# Patient Record
Sex: Female | Born: 1935 | Race: White | Hispanic: No | Marital: Married | Smoking: Former smoker
Health system: Southern US, Community
[De-identification: ages and names within clinical notes are randomized; demographics above are authoritative.]

## PROBLEM LIST (undated history)

## (undated) DIAGNOSIS — R251 Tremor, unspecified: Secondary | ICD-10-CM

## (undated) DIAGNOSIS — R0600 Dyspnea, unspecified: Secondary | ICD-10-CM

## (undated) DIAGNOSIS — R7303 Prediabetes: Secondary | ICD-10-CM

## (undated) DIAGNOSIS — R011 Cardiac murmur, unspecified: Secondary | ICD-10-CM

## (undated) DIAGNOSIS — R52 Pain, unspecified: Secondary | ICD-10-CM

## (undated) DIAGNOSIS — I10 Essential (primary) hypertension: Secondary | ICD-10-CM

## (undated) DIAGNOSIS — C50911 Malignant neoplasm of unspecified site of right female breast: Secondary | ICD-10-CM

## (undated) DIAGNOSIS — C50919 Malignant neoplasm of unspecified site of unspecified female breast: Secondary | ICD-10-CM

## (undated) DIAGNOSIS — D649 Anemia, unspecified: Secondary | ICD-10-CM

## (undated) HISTORY — DX: Malignant neoplasm of unspecified site of right female breast: C50.911

## (undated) HISTORY — PX: ABDOMINAL HYSTERECTOMY: SHX81

## (undated) HISTORY — PX: JOINT REPLACEMENT: SHX530

## (undated) HISTORY — PX: BREAST LUMPECTOMY: SHX2

## (undated) HISTORY — PX: COLON SURGERY: SHX602

---

## 1976-07-13 HISTORY — PX: PARTIAL HYSTERECTOMY: SHX80

## 2004-04-15 ENCOUNTER — Other Ambulatory Visit: Payer: Self-pay

## 2004-04-22 ENCOUNTER — Inpatient Hospital Stay: Payer: Self-pay | Admitting: Unknown Physician Specialty

## 2004-12-30 ENCOUNTER — Ambulatory Visit: Payer: Self-pay | Admitting: Internal Medicine

## 2006-01-21 ENCOUNTER — Ambulatory Visit: Payer: Self-pay | Admitting: Internal Medicine

## 2007-01-25 ENCOUNTER — Ambulatory Visit: Payer: Self-pay | Admitting: Internal Medicine

## 2008-01-27 ENCOUNTER — Ambulatory Visit: Payer: Self-pay | Admitting: Internal Medicine

## 2008-02-07 ENCOUNTER — Ambulatory Visit: Payer: Self-pay | Admitting: Gastroenterology

## 2009-01-28 ENCOUNTER — Ambulatory Visit: Payer: Self-pay | Admitting: Internal Medicine

## 2009-01-31 ENCOUNTER — Ambulatory Visit: Payer: Self-pay | Admitting: Internal Medicine

## 2009-06-17 ENCOUNTER — Ambulatory Visit: Payer: Self-pay | Admitting: Family Medicine

## 2009-06-24 ENCOUNTER — Ambulatory Visit: Payer: Self-pay | Admitting: Specialist

## 2009-09-24 ENCOUNTER — Ambulatory Visit: Payer: Self-pay | Admitting: Specialist

## 2010-02-19 ENCOUNTER — Ambulatory Visit: Payer: Self-pay | Admitting: Specialist

## 2010-07-13 DIAGNOSIS — C50919 Malignant neoplasm of unspecified site of unspecified female breast: Secondary | ICD-10-CM

## 2010-07-13 DIAGNOSIS — C50911 Malignant neoplasm of unspecified site of right female breast: Secondary | ICD-10-CM

## 2010-07-13 HISTORY — DX: Malignant neoplasm of unspecified site of right female breast: C50.911

## 2010-07-13 HISTORY — DX: Malignant neoplasm of unspecified site of unspecified female breast: C50.919

## 2010-07-13 HISTORY — PX: BREAST BIOPSY: SHX20

## 2010-08-19 ENCOUNTER — Ambulatory Visit: Payer: Self-pay | Admitting: Specialist

## 2010-08-26 ENCOUNTER — Ambulatory Visit: Payer: Self-pay | Admitting: Internal Medicine

## 2010-08-27 ENCOUNTER — Ambulatory Visit: Payer: Self-pay | Admitting: Internal Medicine

## 2011-02-19 ENCOUNTER — Ambulatory Visit: Payer: Self-pay | Admitting: Specialist

## 2011-02-26 ENCOUNTER — Ambulatory Visit: Payer: Self-pay | Admitting: Surgery

## 2011-03-30 ENCOUNTER — Ambulatory Visit: Payer: Self-pay | Admitting: Surgery

## 2011-05-01 ENCOUNTER — Ambulatory Visit: Payer: Self-pay | Admitting: Surgery

## 2011-05-07 ENCOUNTER — Ambulatory Visit: Payer: Self-pay | Admitting: Surgery

## 2011-05-14 ENCOUNTER — Ambulatory Visit: Payer: Self-pay | Admitting: Internal Medicine

## 2011-05-22 ENCOUNTER — Ambulatory Visit: Payer: Self-pay | Admitting: Internal Medicine

## 2011-06-13 ENCOUNTER — Ambulatory Visit: Payer: Self-pay | Admitting: Internal Medicine

## 2011-06-15 ENCOUNTER — Ambulatory Visit: Payer: Self-pay | Admitting: Surgery

## 2011-07-14 ENCOUNTER — Ambulatory Visit: Payer: Self-pay | Admitting: Internal Medicine

## 2011-07-17 LAB — CBC CANCER CENTER
Eosinophil #: 0.1 x10 3/mm (ref 0.0–0.7)
Lymphocyte #: 0.7 x10 3/mm — ABNORMAL LOW (ref 1.0–3.6)
Lymphocyte %: 12 %
MCV: 89 fL (ref 80–100)
Monocyte #: 0.7 x10 3/mm (ref 0.0–0.7)
Neutrophil %: 74.3 %
Platelet: 178 x10 3/mm (ref 150–440)
RBC: 4.16 10*6/uL (ref 3.80–5.20)
RDW: 14.2 % (ref 11.5–14.5)
WBC: 6.2 x10 3/mm (ref 3.6–11.0)

## 2011-07-17 LAB — BASIC METABOLIC PANEL
Anion Gap: 6 — ABNORMAL LOW (ref 7–16)
BUN: 17 mg/dL (ref 7–18)
Calcium, Total: 9.9 mg/dL (ref 8.5–10.1)
Chloride: 102 mmol/L (ref 98–107)
Co2: 31 mmol/L (ref 21–32)
Creatinine: 0.79 mg/dL (ref 0.60–1.30)
EGFR (African American): >60
EGFR (Non-African Amer.): >60
Osmolality: 280 (ref 275–301)

## 2011-07-17 LAB — HEPATIC FUNCTION PANEL A (ARMC)
Bilirubin,Total: 0.2 mg/dL (ref 0.2–1.0)
SGOT(AST): 13 U/L — ABNORMAL LOW (ref 15–37)
Total Protein: 7 g/dL (ref 6.4–8.2)

## 2011-08-14 ENCOUNTER — Ambulatory Visit: Payer: Self-pay | Admitting: Internal Medicine

## 2011-09-23 ENCOUNTER — Ambulatory Visit: Payer: Self-pay | Admitting: Internal Medicine

## 2011-10-12 ENCOUNTER — Ambulatory Visit: Payer: Self-pay | Admitting: Internal Medicine

## 2011-10-23 LAB — CBC CANCER CENTER
Basophil #: 0 x10 3/mm (ref 0.0–0.1)
Basophil %: 0.1 %
HCT: 34.5 % — ABNORMAL LOW (ref 35.0–47.0)
Lymphocyte #: 0.5 x10 3/mm — ABNORMAL LOW (ref 1.0–3.6)
Lymphocyte %: 12.2 %
MCHC: 33.4 g/dL (ref 32.0–36.0)
MCV: 91 fL (ref 80–100)
Monocyte #: 0.6 x10 3/mm (ref 0.2–0.9)
Neutrophil %: 73.2 %
RBC: 3.8 10*6/uL (ref 3.80–5.20)
RDW: 13.3 % (ref 11.5–14.5)
WBC: 4.4 x10 3/mm (ref 3.6–11.0)

## 2011-10-23 LAB — HEPATIC FUNCTION PANEL A (ARMC)
Albumin: 3.4 g/dL (ref 3.4–5.0)
Bilirubin,Total: 0.3 mg/dL (ref 0.2–1.0)
SGPT (ALT): 20 U/L

## 2011-10-23 LAB — CREATININE, SERUM: EGFR (Non-African Amer.): >60

## 2011-11-11 ENCOUNTER — Ambulatory Visit: Payer: Self-pay | Admitting: Internal Medicine

## 2012-02-01 ENCOUNTER — Ambulatory Visit: Payer: Self-pay | Admitting: Surgery

## 2012-03-30 ENCOUNTER — Ambulatory Visit: Payer: Self-pay | Admitting: Internal Medicine

## 2012-04-12 ENCOUNTER — Ambulatory Visit: Payer: Self-pay | Admitting: Internal Medicine

## 2012-04-20 ENCOUNTER — Ambulatory Visit: Payer: Self-pay | Admitting: Radiation Oncology

## 2012-04-22 LAB — CBC CANCER CENTER
Basophil #: 0 x10 3/mm (ref 0.0–0.1)
HGB: 11.5 g/dL — ABNORMAL LOW (ref 12.0–16.0)
Lymphocyte #: 0.8 x10 3/mm — ABNORMAL LOW (ref 1.0–3.6)
MCH: 30.8 pg (ref 26.0–34.0)
MCHC: 33.1 g/dL (ref 32.0–36.0)
Monocyte #: 0.7 x10 3/mm (ref 0.2–0.9)
Monocyte %: 13.2 %
Neutrophil %: 70.6 %
Platelet: 183 x10 3/mm (ref 150–440)
RBC: 3.74 10*6/uL — ABNORMAL LOW (ref 3.80–5.20)
RDW: 12.9 % (ref 11.5–14.5)
WBC: 5.5 x10 3/mm (ref 3.6–11.0)

## 2012-04-22 LAB — HEPATIC FUNCTION PANEL A (ARMC)
Albumin: 3.4 g/dL (ref 3.4–5.0)
Alkaline Phosphatase: 46 U/L — ABNORMAL LOW (ref 50–136)
Bilirubin,Total: 0.2 mg/dL (ref 0.2–1.0)
SGOT(AST): 15 U/L (ref 15–37)
SGPT (ALT): 20 U/L (ref 12–78)
Total Protein: 6.7 g/dL (ref 6.4–8.2)

## 2012-04-22 LAB — CREATININE, SERUM: EGFR (African American): >60

## 2012-05-13 ENCOUNTER — Ambulatory Visit: Payer: Self-pay | Admitting: Internal Medicine

## 2012-10-20 ENCOUNTER — Ambulatory Visit: Payer: Self-pay | Admitting: Surgery

## 2013-03-28 ENCOUNTER — Ambulatory Visit: Payer: Self-pay | Admitting: Oncology

## 2013-04-12 ENCOUNTER — Ambulatory Visit: Payer: Self-pay | Admitting: Oncology

## 2013-04-21 ENCOUNTER — Ambulatory Visit: Payer: Self-pay | Admitting: Surgery

## 2013-04-24 LAB — CBC CANCER CENTER
Basophil #: 0 x10 3/mm (ref 0.0–0.1)
Eosinophil #: 0.1 x10 3/mm (ref 0.0–0.7)
HCT: 33.2 % — ABNORMAL LOW (ref 35.0–47.0)
Lymphocyte #: 1.1 x10 3/mm (ref 1.0–3.6)
Lymphocyte %: 19.4 %
MCHC: 34.4 g/dL (ref 32.0–36.0)
Monocyte #: 0.6 x10 3/mm (ref 0.2–0.9)
Monocyte %: 10 %
Neutrophil %: 68.5 %
RDW: 12.9 % (ref 11.5–14.5)
WBC: 5.8 x10 3/mm (ref 3.6–11.0)

## 2013-04-24 LAB — HEPATIC FUNCTION PANEL A (ARMC)
Albumin: 3.3 g/dL — ABNORMAL LOW (ref 3.4–5.0)
Bilirubin, Direct: 0.1 mg/dL (ref 0.00–0.20)
Bilirubin,Total: 0.2 mg/dL (ref 0.2–1.0)
SGOT(AST): 14 U/L — ABNORMAL LOW (ref 15–37)
SGPT (ALT): 22 U/L (ref 12–78)
Total Protein: 6.3 g/dL — ABNORMAL LOW (ref 6.4–8.2)

## 2013-04-24 LAB — CREATININE, SERUM
Creatinine: 0.79 mg/dL (ref 0.60–1.30)
EGFR (African American): >60

## 2013-05-13 ENCOUNTER — Ambulatory Visit: Payer: Self-pay | Admitting: Oncology

## 2013-05-16 IMAGING — CT CT SIM MISC
1 of 2 series · 16 of 32 positions shown, 20 images · non-contrast
Comparison: none

[Series 2: — · axial · 1.07mm/px · z∈[-841,-607]mm · 16 of 86 slices shown, 20 images]
[im 4/86  mediastinal]
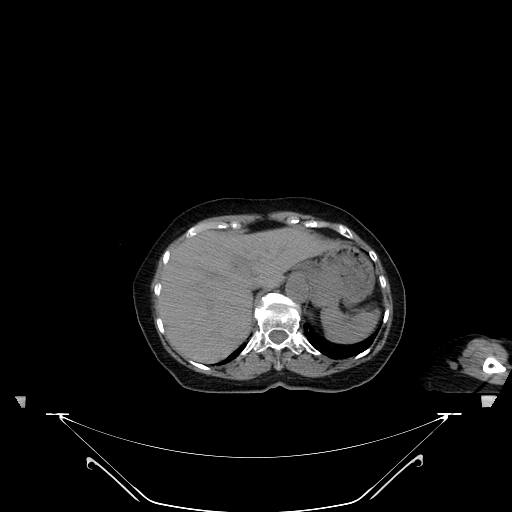
[im 4/86  lung]
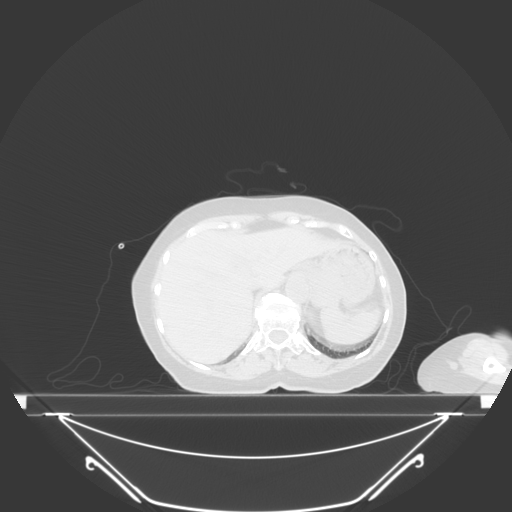
[im 10/86  lung]
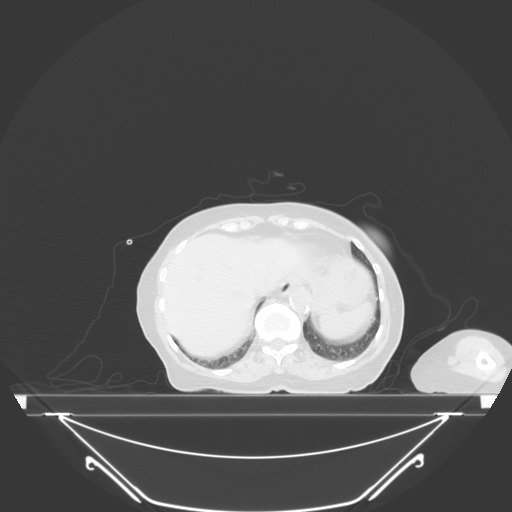
[im 16/86  lung]
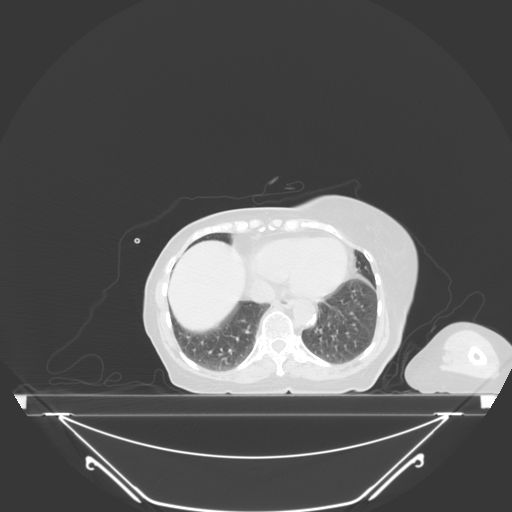
[im 19/86  lung]
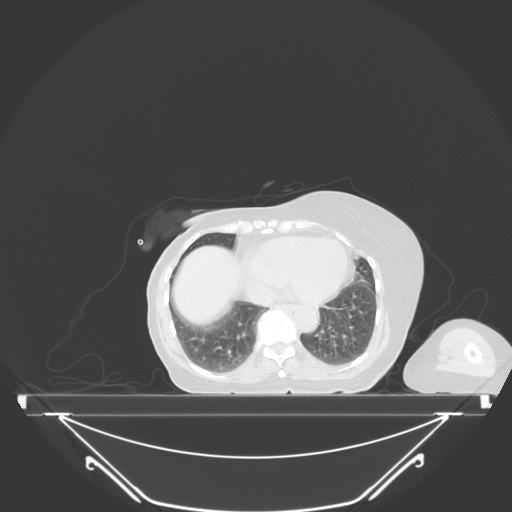
[im 26/86  mediastinal]
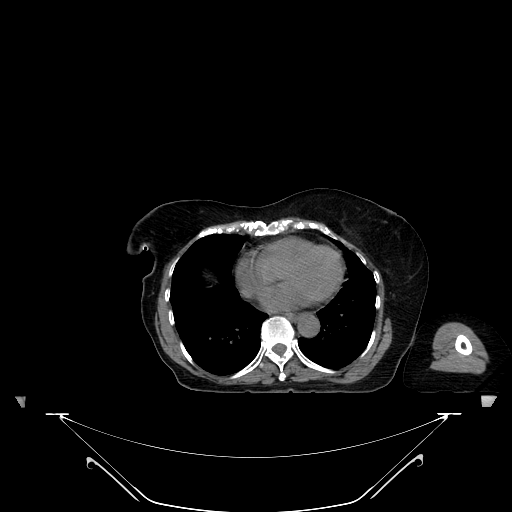
[im 26/86  lung]
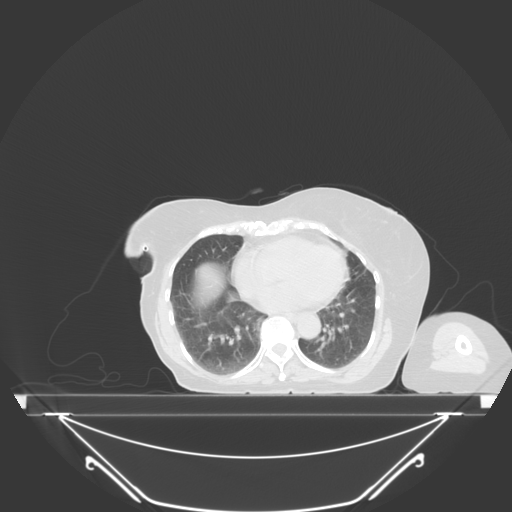
[im 32/86  lung]
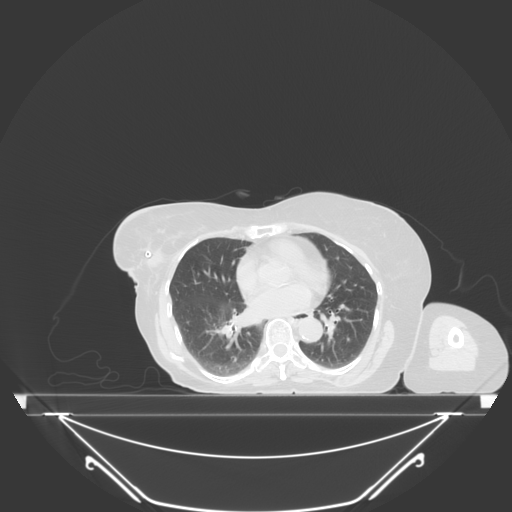
[im 35/86  lung]
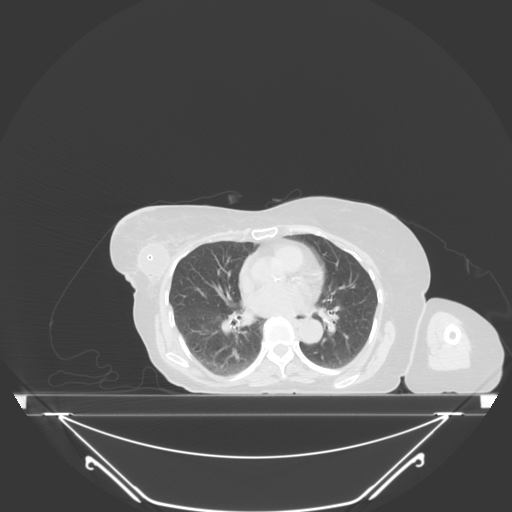
[im 41/86  lung]
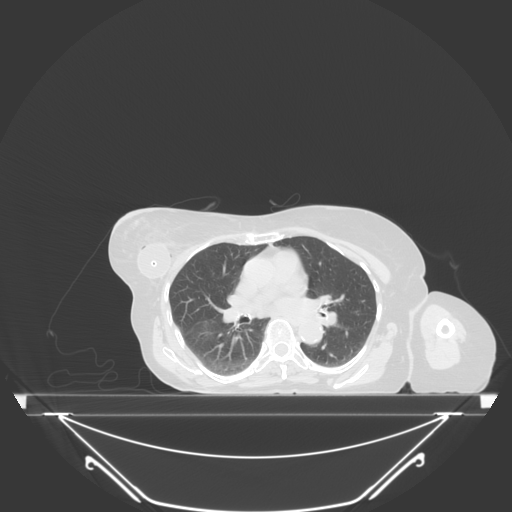
[im 45/86  mediastinal]
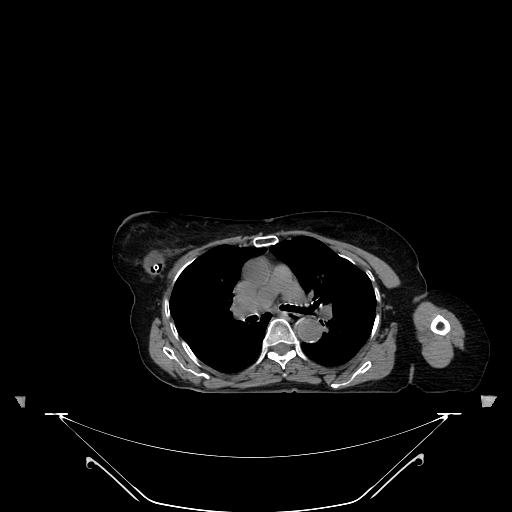
[im 45/86  lung]
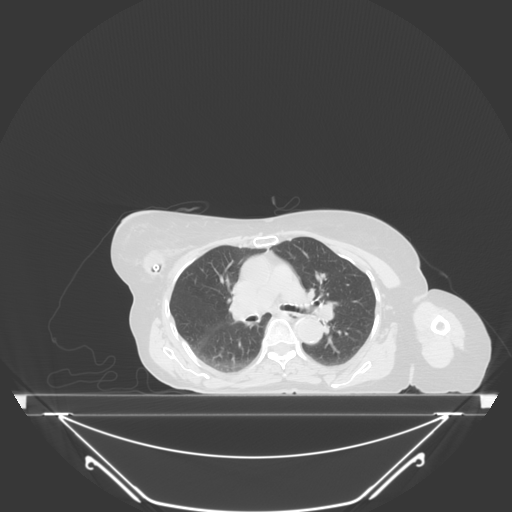
[im 51/86  lung]
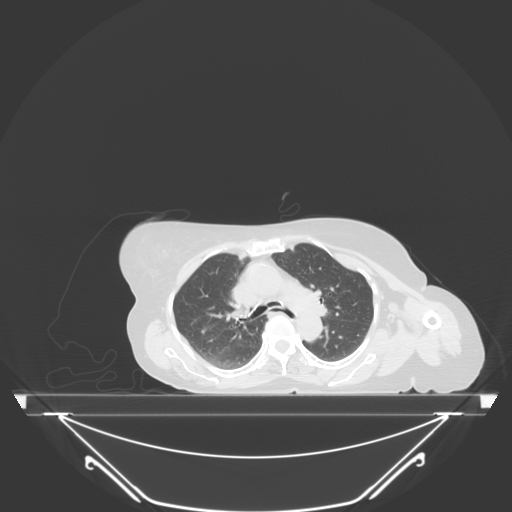
[im 54/86  lung]
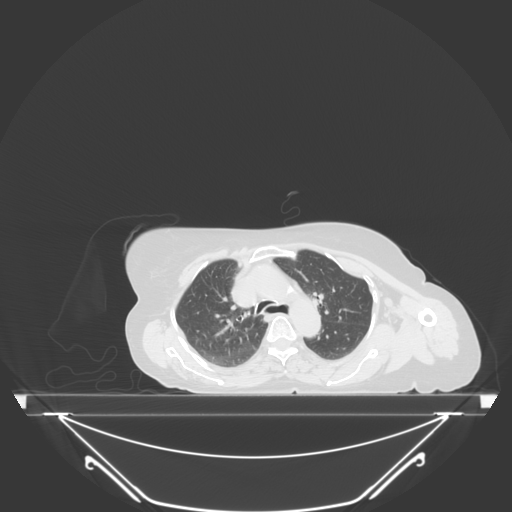
[im 60/86  lung]
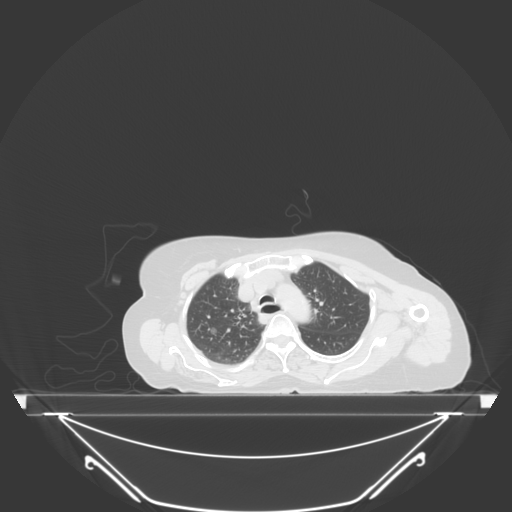
[im 67/86  mediastinal]
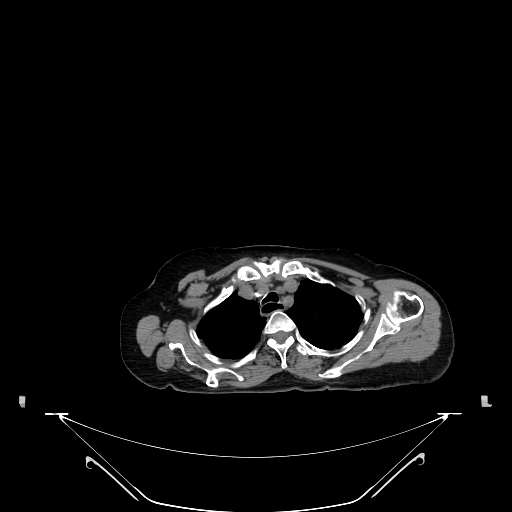
[im 67/86  lung]
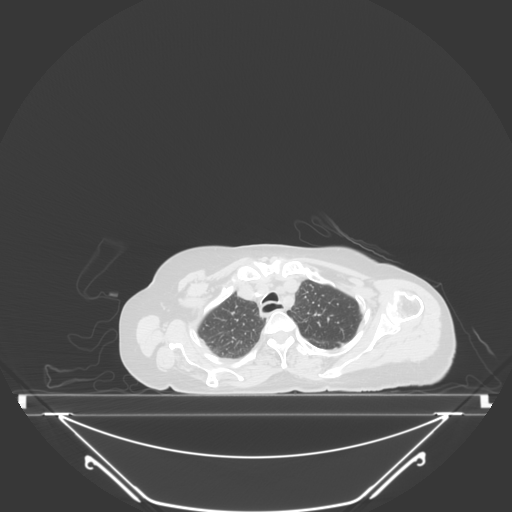
[im 70/86  lung]
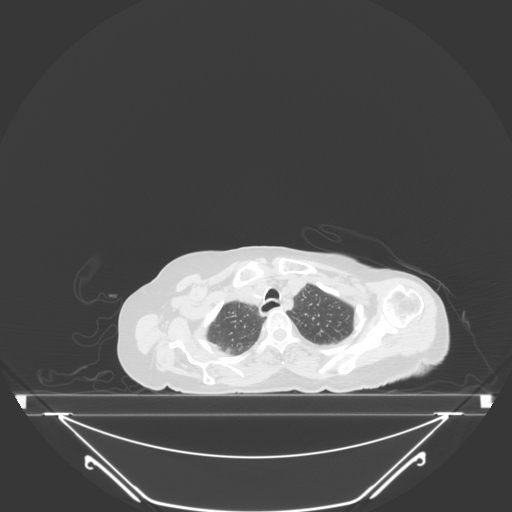
[im 76/86  lung]
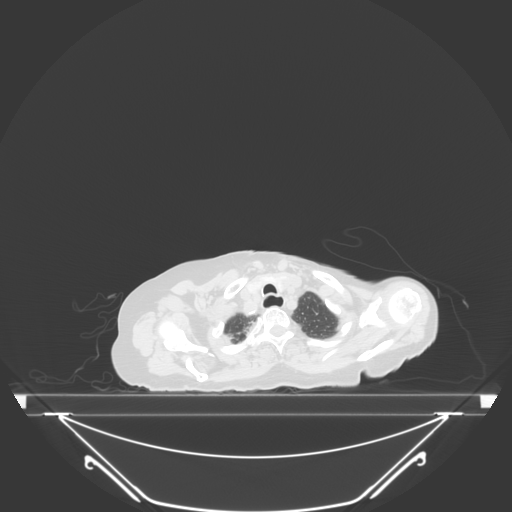
[im 82/86  lung]
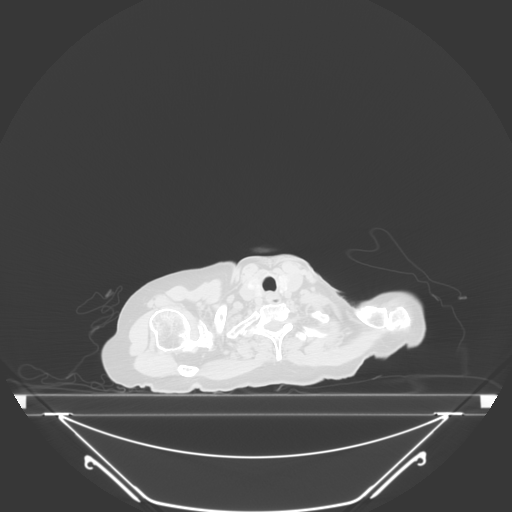

[16 of 32 positions shown; findings below may reference images not displayed]

IMAGES IMPORTED FROM THE [employer] WORKFLOW SYSTEM
NO DICTATION FOR STUDY

## 2014-03-20 ENCOUNTER — Ambulatory Visit: Payer: Self-pay | Admitting: Radiation Oncology

## 2014-04-12 ENCOUNTER — Ambulatory Visit: Payer: Self-pay | Admitting: Radiation Oncology

## 2014-05-07 ENCOUNTER — Ambulatory Visit: Payer: Self-pay | Admitting: Surgery

## 2014-10-24 ENCOUNTER — Ambulatory Visit
Admit: 2014-10-24 | Disposition: A | Discharge status: Home / Self Care | Payer: Self-pay | Attending: Internal Medicine | Admitting: Internal Medicine

## 2014-10-24 LAB — CBC CANCER CENTER
Basophil #: 0 x10 3/mm (ref 0.0–0.1)
Basophil %: 0.1 %
Eosinophil #: 0.1 x10 3/mm (ref 0.0–0.7)
Eosinophil %: 1.6 %
HCT: 34.4 % — ABNORMAL LOW (ref 35.0–47.0)
HGB: 11.5 g/dL — ABNORMAL LOW (ref 12.0–16.0)
LYMPHS PCT: 20.8 %
Lymphocyte #: 1.2 x10 3/mm (ref 1.0–3.6)
MCH: 31.2 pg (ref 26.0–34.0)
MCHC: 33.4 g/dL (ref 32.0–36.0)
MCV: 93 fL (ref 80–100)
MONO ABS: 0.7 x10 3/mm (ref 0.2–0.9)
Monocyte %: 11.7 %
NEUTROS PCT: 65.8 %
Neutrophil #: 3.9 x10 3/mm (ref 1.4–6.5)
PLATELETS: 180 x10 3/mm (ref 150–440)
RBC: 3.68 10*6/uL — ABNORMAL LOW (ref 3.80–5.20)
RDW: 12.9 % (ref 11.5–14.5)
WBC: 5.8 x10 3/mm (ref 3.6–11.0)

## 2014-10-24 LAB — HEPATIC FUNCTION PANEL A (ARMC)
ALK PHOS: 37 U/L — AB
ALT: 13 U/L — AB
Albumin: 3.6 g/dL
BILIRUBIN TOTAL: 0.5 mg/dL
Bilirubin, Direct: <0.1 mg/dL
SGOT(AST): 15 U/L
TOTAL PROTEIN: 6.4 g/dL — AB

## 2014-10-24 LAB — CREATININE, SERUM
CREATININE: 0.61 mg/dL
EGFR (African American): >60

## 2015-01-03 ENCOUNTER — Telehealth: Payer: Self-pay | Admitting: *Deleted

## 2015-01-03 MED ORDER — TAMOXIFEN CITRATE 20 MG PO TABS: 20.0000 mg | ORAL_TABLET | Freq: Every day | ORAL | Status: DC

## 2015-01-03 NOTE — Addendum Note (Signed)
Addended by: Betti Cruz on: 01/03/2015 04:16 PM   Modules accepted: Orders

## 2015-01-03 NOTE — Telephone Encounter (Signed)
escribed

## 2015-01-10 ENCOUNTER — Telehealth: Payer: Self-pay | Admitting: *Deleted

## 2015-01-10 MED ORDER — TAMOXIFEN CITRATE 20 MG PO TABS: 20.0000 mg | ORAL_TABLET | Freq: Every day | ORAL | Status: DC

## 2015-01-10 NOTE — Telephone Encounter (Signed)
Resubmitted tamoxifen to [facility]

## 2015-03-21 ENCOUNTER — Ambulatory Visit: Payer: Medicare Other | Attending: Radiation Oncology | Admitting: Radiation Oncology

## 2015-04-19 ENCOUNTER — Ambulatory Visit
Admission: RE | Admit: 2015-04-19 | Discharge: 2015-04-19 | Disposition: A | Discharge status: Home / Self Care | Payer: Medicare Other | Source: Ambulatory Visit | Attending: Radiation Oncology | Admitting: Radiation Oncology

## 2015-04-19 ENCOUNTER — Encounter: Payer: Self-pay | Admitting: Radiation Oncology

## 2015-04-19 ENCOUNTER — Other Ambulatory Visit: Payer: Self-pay | Admitting: *Deleted

## 2015-04-19 VITALS — BP 178/77 | HR 74 | Temp 98.0°F | Wt 132.7 lb

## 2015-04-19 DIAGNOSIS — Z853 Personal history of malignant neoplasm of breast: Secondary | ICD-10-CM

## 2015-04-19 DIAGNOSIS — C50511 Malignant neoplasm of lower-outer quadrant of right female breast: Secondary | ICD-10-CM

## 2015-04-19 HISTORY — DX: Malignant neoplasm of unspecified site of unspecified female breast: C50.919

## 2015-04-19 HISTORY — DX: Cardiac murmur, unspecified: R01.1

## 2015-04-19 HISTORY — DX: Essential (primary) hypertension: I10

## 2015-04-19 NOTE — Progress Notes (Signed)
Radiation Oncology Follow up Note  Name: Melinda Little   Date:   04/19/2015 MRN:  [id number] DOB: Sep 27, 1929    This 81 y.o. female presents to the clinic today for follow-up for breast cancer.  REFERRING PROVIDER: Dion Body, MD  HPI: Patient is an 81 year old female now out over 4 years having completed radiation therapy using high dose rate remote afterloading to her right breast for ductal carcinoma in situ ER/PR positive. She is seen today in routine follow-up and is doing well she is currently on tamoxifen without side effect.. Follow-up mammograms have been fine she has not had one since last March. And we are reordering diagnostic mammograms at this time. She specifically denies breast tenderness cough or bone pain.  COMPLICATIONS OF TREATMENT: none  FOLLOW UP COMPLIANCE: keeps appointments   PHYSICAL EXAM:  BP 178/77 mmHg  Pulse 74  Temp(Src) 98 F (36.7 C)  Wt 132 lb 11.5 oz (60.2 kg) Lungs are clear to A&P cardiac examination essentially unremarkable with regular rate and rhythm. No dominant mass or nodularity is noted in either breast in 2 positions examined. Incision is well-healed. No axillary or supraclavicular adenopathy is appreciated. Cosmetic result is excellent. She still has some telangiectatic changes of the skin in the area of high dose rate treatment. Cosmetic result is still good to excellent. Well-developed well-nourished patient in NAD. HEENT reveals PERLA, EOMI, discs not visualized.  Oral cavity is clear. No oral mucosal lesions are identified. Neck is clear without evidence of cervical or supraclavicular adenopathy. Lungs are clear to A&P. Cardiac examination is essentially unremarkable with regular rate and rhythm without murmur rub or thrill. Abdomen is benign with no organomegaly or masses noted. Motor sensory and DTR levels are equal and symmetric in the upper and lower extremities. Cranial nerves II through XII are grossly intact. Proprioception is  intact. No peripheral adenopathy or edema is identified. No motor or sensory levels are noted. Crude visual fields are within normal range.  RADIOLOGY RESULTS: Previous mammograms have been requested for my review  PLAN: Present time she is now over 4 years out with no evidence of disease. I've ordered diagnostic mammograms and will see her back in 1 year for follow-up. I will continue to follow patient has some are only breast specialist that is following her at this time. Patient knows to call sooner with any concerns.  I would like to take this opportunity for allowing me to participate in the care of your patient.Armstead Peaks., MD

## 2015-05-20 ENCOUNTER — Other Ambulatory Visit: Payer: Self-pay | Admitting: Radiation Oncology

## 2015-05-20 ENCOUNTER — Ambulatory Visit
Admission: RE | Admit: 2015-05-20 | Discharge: 2015-05-20 | Disposition: A | Discharge status: Home / Self Care | Payer: Medicare Other | Source: Ambulatory Visit | Attending: Radiation Oncology | Admitting: Radiation Oncology

## 2015-05-20 DIAGNOSIS — Z853 Personal history of malignant neoplasm of breast: Secondary | ICD-10-CM

## 2015-08-25 ENCOUNTER — Emergency Department: Payer: Medicare Other

## 2015-08-25 ENCOUNTER — Emergency Department
Admission: EM | Admit: 2015-08-25 | Discharge: 2015-08-25 | Disposition: A | Discharge status: Home / Self Care | Payer: Medicare Other | Attending: Emergency Medicine | Admitting: Emergency Medicine

## 2015-08-25 DIAGNOSIS — S32591A Other specified fracture of right pubis, initial encounter for closed fracture: Secondary | ICD-10-CM

## 2015-08-25 DIAGNOSIS — Y998 Other external cause status: Secondary | ICD-10-CM | POA: Insufficient documentation

## 2015-08-25 DIAGNOSIS — Z791 Long term (current) use of non-steroidal anti-inflammatories (NSAID): Secondary | ICD-10-CM | POA: Diagnosis not present

## 2015-08-25 DIAGNOSIS — Y9301 Activity, walking, marching and hiking: Secondary | ICD-10-CM | POA: Diagnosis not present

## 2015-08-25 DIAGNOSIS — W010XXA Fall on same level from slipping, tripping and stumbling without subsequent striking against object, initial encounter: Secondary | ICD-10-CM | POA: Diagnosis not present

## 2015-08-25 DIAGNOSIS — S3289XA Fracture of other parts of pelvis, initial encounter for closed fracture: Secondary | ICD-10-CM | POA: Diagnosis not present

## 2015-08-25 DIAGNOSIS — M25559 Pain in unspecified hip: Secondary | ICD-10-CM

## 2015-08-25 DIAGNOSIS — S329XXA Fracture of unspecified parts of lumbosacral spine and pelvis, initial encounter for closed fracture: Secondary | ICD-10-CM

## 2015-08-25 DIAGNOSIS — Y92480 Sidewalk as the place of occurrence of the external cause: Secondary | ICD-10-CM | POA: Diagnosis not present

## 2015-08-25 DIAGNOSIS — I10 Essential (primary) hypertension: Secondary | ICD-10-CM | POA: Diagnosis not present

## 2015-08-25 DIAGNOSIS — S3993XA Unspecified injury of pelvis, initial encounter: Secondary | ICD-10-CM | POA: Diagnosis present

## 2015-08-25 DIAGNOSIS — Z79899 Other long term (current) drug therapy: Secondary | ICD-10-CM | POA: Diagnosis not present

## 2015-08-25 DIAGNOSIS — Z7982 Long term (current) use of aspirin: Secondary | ICD-10-CM | POA: Insufficient documentation

## 2015-08-25 NOTE — ED Notes (Signed)
Patient states that she was walking out side yesterday and lost her balance and fell. Patient states that she is not sure if she hit her head or not. Patient is c/o right thigh pain, Patient is having trouble bearing weight on the right leg. Per daughter it took her 15 minutes to walk to the bathroom this morning and that usually only takes her about 3 minutes.

## 2015-08-25 NOTE — ED Notes (Signed)
Pt reports tripping and falling yesterday. Continued pain to right leg and left shoulder after fall.

## 2015-08-25 NOTE — ED Provider Notes (Signed)
[facility] Emergency Department Provider Note ____________________________________________  Time seen: 1354  I have reviewed the triage vital signs and the nursing notes.  HISTORY  Chief Complaint  Fall  HPI Melinda Little is a 82 y.o. female presents to the ED accompanied by her husband in an adult daughter for evaluation of pain and disability following a fall yesterday. The patient describes she was walking up the sidewalk at her daughter's home just behind her husband, when she accidentally tripped, falling off balance to the right side. She describes falling off of the sidewalk onto the grass. There was no reported head injury or loss of consciousness. The patient has atendency to drag her right foot as she walks due to chronic DJD of the right knee. She reports that this morning she had difficulty ambulating with the use of her walker due to increased pain to the right thigh and hip. She is here for evaluation of her injuries, reporting pain at 4/10 in triage. She doses tramadol intermittently for pain relief. She took one this morning at breakfast.  Past Medical History  Diagnosis Date  . Hypertension   . Heart murmur   . Breast cancer ([place]) 2012    right breast with lumpectomy and mammosite rad tx   There are no active problems to display for this patient.   Past Surgical History  Procedure Laterality Date  . Breast biopsy Right 2012    positve  . Breast biopsy Right 2012    negative    Current Outpatient Rx  Name  Route  Sig  Dispense  Refill  . aspirin EC 81 MG tablet   Oral   Take by mouth.         . Cholecalciferol (VITAMIN D-1000 MAX ST) 1000 UNITS tablet   Oral   Take by mouth.         . ferrous sulfate 325 (65 FE) MG tablet   Oral   Take by mouth.         Marland Kitchen ibuprofen (ADVIL,MOTRIN) 200 MG tablet   Oral   Take by mouth.         Marland Kitchen lisinopril (PRINIVIL,ZESTRIL) 10 MG tablet      TAKE 1 TABLET BY MOUTH EVERY DAY          . tamoxifen (NOLVADEX) 20 MG tablet   Oral   Take 1 tablet (20 mg total) by mouth daily.   90 tablet   3   . tamoxifen (NOLVADEX) 20 MG tablet   Oral   Take by mouth.          Allergies Ciprofloxacin  No family history on file.  Social History Social History  Substance Use Topics  . Smoking status: Not on file  . Smokeless tobacco: Not on file  . Alcohol Use: Not on file   Review of Systems  Constitutional: Negative for fever. Cardiovascular: Negative for chest pain. Respiratory: Negative for shortness of breath. Gastrointestinal: Negative for abdominal pain, vomiting and diarrhea. Musculoskeletal: Negative for back pain. Right hip and thigh pain as above. Skin: Negative for rash. Neurological: Negative for headaches, focal weakness or numbness. ____________________________________________  PHYSICAL EXAM:  VITAL SIGNS: ED Triage Vitals  Enc Vitals Group     BP 08/25/15 1141 158/50 mmHg     Pulse Rate 08/25/15 1141 90     Resp 08/25/15 1141 18     Temp 08/25/15 1141 98.5 F (36.9 C)     Temp Source 08/25/15 1141 Oral  SpO2 08/25/15 1141 99 %     Weight 08/25/15 1141 130 lb (58.968 kg)     Height 08/25/15 1141 5\' 4"  (1.626 m)     Head Cir --      Peak Flow --      Pain Score 08/25/15 1143 4     Pain Loc --      Pain Edu? --      Excl. in [place]? --    Constitutional: Alert and oriented. Well appearing and in no distress. Head: Normocephalic and atraumatic.      Eyes: Conjunctivae are normal. PERRL. Normal extraocular movements      Ears: Canals clear. TMs intact bilaterally.   Nose: No congestion/rhinorrhea.   Mouth/Throat: Mucous membranes are moist.   Neck: Supple. No thyromegaly. Hematological/Lymphatic/Immunological: No cervical lymphadenopathy. Cardiovascular: Normal rate, regular rhythm.  Respiratory: Normal respiratory effort. No wheezes/rales/rhonchi. Gastrointestinal: Soft and nontender. No distention. Musculoskeletal: RLE  without obvious rotation, shortening or deformity. Right knee with decreased extension ROM due to chronic, severe, DJD. Patient localizes tenderness to the posterior thigh and buttocks. Normal seated SLR bilaterally. Nontender with normal range of motion in all extremities.  Neurologic:  Normal gait without ataxia. Normal speech and language. No gross focal neurologic deficits are appreciated. Skin:  Skin is warm, dry and intact. No rash noted. Psychiatric: Mood and affect are normal. Patient exhibits appropriate insight and judgment. ____________________________________________   RADIOLOGY  Right Hip IMPRESSION: Advanced osteoarthritis in the knee joint region with small joint effusion. Mild narrowing right hip joint. No acute fracture or Dislocation.  Pelvis IMPRESSION: Displaced fractures of the right superior pubic ramus and ischium. No other fractures. No dislocation. Mild symmetric narrowing both hip joints. Bones osteoporotic  I, Alyla Pietila, Dannielle Karvonen, personally viewed and evaluated these images (plain radiographs) as part of my medical decision making, as well as reviewing the written report by the radiologist. ____________________________________________  [facility] provider ____________________________________________  INITIAL IMPRESSION / ASSESSMENT AND PLAN / ED COURSE  ----------------------------------------- 3:45 PM on 08/25/2015 ----------------------------------------- Spoke with Dr. Roland Rack, he will consult the patient here in the ED. Following review of films, Dr. Roland Rack advises that the fractures are stable. The patient may opt for inpatient management of pain if needed. Otherwise, if stable, patient may discharge home and follow-up with him in 3 weeks. Patient should be toe-touch to partial weight bearing with the walker.   Patient decided she did not want to be admitted to the hospital at this time. She opted to be discharged home to the care of her  husband. She was able to demonstrate ability to ambulate with the use of the walker without difficulty. Patient was advised to dose her previous he prescribed tramadol up to twice daily for pain relief. She is also encouraged and reminded to limit walking and standing due to this injury. She will follow Dr. Roland Rack as directed or return to the ED for acutely worsening symptoms including pain, disability, or SOB. ____________________________________________  FINAL CLINICAL IMPRESSION(S) / ED DIAGNOSES  Final diagnoses:  Pelvic fracture, closed, initial encounter  Pubic ramus fracture, right, closed, initial encounter [facility])      Melvenia Needles, PA-C 08/27/15 0034  Carrie Mew, MD 08/27/15 2316

## 2015-08-25 NOTE — Discharge Instructions (Signed)
Simple Pelvic Fracture, Adult °A pelvic fracture is a break in one of the pelvic bones. The pelvic bones include the bones that you sit on and the bones that make up the lower part of your spine. A pelvic fracture is called simple if the broken bones are stable and are not moving out of place. °CAUSES  °Common causes of this type of fracture include: °· A fall. °· A car accident. °· Force or pressure applied to the pelvis. °RISK FACTORS °You may be at higher risk for this type of fracture if: °· You play high-impact sports. °· You are an older person with a condition that causes weak bones (osteoporosis). °· You have a bone-weakening disease. °SIGNS AND SYMPTOMS °Signs and symptoms may include: °· Tenderness, swelling, or bruising in the affected area. °· Pain when moving the hip. °· Pain when walking or standing. °DIAGNOSIS °A diagnosis is made with a physical exam and X-rays. Sometimes, a CT scan is also done. °TREATMENT °The goal of treatment is to get the bones to heal in a good position. Treatment of a simple pelvic fracture usually involves staying in bed (bed rest) and using crutches or a walker until the bones heal. Medicines may be prescribed for pain. Medicines may also be prescribed that help to prevent blood clots from forming in the legs. °HOME CARE INSTRUCTIONS °Managing Pain, Stiffness, and Swelling °· If directed, apply ice to the injured area: °¨ Put ice in a plastic bag. °¨ Place a towel between your skin and the bag. °¨ Leave the ice on for 20 minutes, 2-3 times a day. °· Raise the injured area above the level of your heart while you are sitting or lying down. °Driving °· Do not  drive or operate heavy machinery until your health care provider tells you it is safe to do. °Activity °· Stay on bed rest for as long as directed by your health care provider. °· While on bed rest: °¨ Change the position of your legs every 1-2 hours. This keeps blood moving well through both of your legs. °¨ You may sit  for as long as you feel comfortable. °· After bed rest: °¨ Avoid strenuous activities for as long as directed by your health care provider. °¨ Return to your normal activities as directed by your health care provider. Ask your health care provider what activities are safe for you. °Safety °· Do not use the injured limb to support your body weight until your health care provider says that you can. Use crutches or a walker as directed by your health care provider. °General Instructions °· Do not use any tobacco products, including cigarettes, chewing tobacco, or electronic cigarettes. Tobacco can delay bone healing. If you need help quitting, ask your health care provider. °· Take medicines only as directed by your health care provider. °· Keep all follow-up visits as directed by your health care provider. This is important. °SEEK MEDICAL CARE IF: °· Your pain gets worse. °· Your pain is not relieved with medicines. °SEEK IMMEDIATE MEDICAL CARE IF: °· You feel light-headed or faint. °· You develop chest pain. °· You develop shortness of breath. °· You have a fever. °· You have blood in your urine or your stools. °· You have vaginal bleeding. °· You have difficulty or pain with urination or with a bowel movement. °· You have difficulty or increased pain with walking. °· You have new or increased swelling in one of your legs. °· You have numbness in your   legs or groin area.   This information is not intended to replace advice given to you by your health care provider. Make sure you discuss any questions you have with your health care provider.   Document Released: 09/07/2001 Document Revised: 07/20/2014 Document Reviewed: 02/20/2014 Elsevier Interactive Patient Education 2016 [place] should dose your home Tramadol as needed for pain. Rest with the legs elevated when seated.  Use the walker to stand and walk. You should not bear weight on the right side. Use only the toes down to stabilize yourself.  Follow-up with Dr. Roland Rack as directed. Return to the ED as needed for any worsening of pain or mobility.

## 2015-10-25 ENCOUNTER — Inpatient Hospital Stay (HOSPITAL_BASED_OUTPATIENT_CLINIC_OR_DEPARTMENT_OTHER): Payer: Medicare Other

## 2015-10-25 ENCOUNTER — Other Ambulatory Visit: Payer: Self-pay | Admitting: *Deleted

## 2015-10-25 ENCOUNTER — Inpatient Hospital Stay: Payer: Medicare Other | Attending: Internal Medicine | Admitting: Internal Medicine

## 2015-10-25 ENCOUNTER — Encounter: Payer: Self-pay | Admitting: Internal Medicine

## 2015-10-25 ENCOUNTER — Encounter: Payer: Self-pay | Admitting: *Deleted

## 2015-10-25 VITALS — BP 155/66 | HR 77 | Temp 97.0°F | Resp 18 | Wt 127.9 lb

## 2015-10-25 DIAGNOSIS — Z170 Estrogen receptor positive status [ER+]: Secondary | ICD-10-CM | POA: Diagnosis not present

## 2015-10-25 DIAGNOSIS — I10 Essential (primary) hypertension: Secondary | ICD-10-CM | POA: Insufficient documentation

## 2015-10-25 DIAGNOSIS — Z79899 Other long term (current) drug therapy: Secondary | ICD-10-CM | POA: Insufficient documentation

## 2015-10-25 DIAGNOSIS — D649 Anemia, unspecified: Secondary | ICD-10-CM | POA: Insufficient documentation

## 2015-10-25 DIAGNOSIS — E871 Hypo-osmolality and hyponatremia: Secondary | ICD-10-CM | POA: Insufficient documentation

## 2015-10-25 DIAGNOSIS — Z853 Personal history of malignant neoplasm of breast: Secondary | ICD-10-CM | POA: Insufficient documentation

## 2015-10-25 DIAGNOSIS — Z923 Personal history of irradiation: Secondary | ICD-10-CM | POA: Diagnosis not present

## 2015-10-25 DIAGNOSIS — C50911 Malignant neoplasm of unspecified site of right female breast: Secondary | ICD-10-CM

## 2015-10-25 DIAGNOSIS — Z7982 Long term (current) use of aspirin: Secondary | ICD-10-CM | POA: Diagnosis not present

## 2015-10-25 LAB — COMPREHENSIVE METABOLIC PANEL
ALK PHOS: 134 U/L — AB (ref 38–126)
ALT: 16 U/L (ref 14–54)
AST: 19 U/L (ref 15–41)
Albumin: 4.1 g/dL (ref 3.5–5.0)
Anion gap: 3 — ABNORMAL LOW (ref 5–15)
BILIRUBIN TOTAL: 0.4 mg/dL (ref 0.3–1.2)
BUN: 15 mg/dL (ref 6–20)
CHLORIDE: 102 mmol/L (ref 101–111)
CO2: 23 mmol/L (ref 22–32)
Calcium: 8.5 mg/dL — ABNORMAL LOW (ref 8.9–10.3)
Creatinine, Ser: 0.62 mg/dL (ref 0.44–1.00)
GFR calc Af Amer: >60 mL/min (ref >60)
Glucose, Bld: 113 mg/dL — ABNORMAL HIGH (ref 65–99)
POTASSIUM: 4.2 mmol/L (ref 3.5–5.1)
Sodium: 128 mmol/L — ABNORMAL LOW (ref 135–145)
TOTAL PROTEIN: 6.8 g/dL (ref 6.5–8.1)

## 2015-10-25 LAB — CBC WITH DIFFERENTIAL/PLATELET
BASOS ABS: 0 10*3/uL (ref 0–0.1)
Basophils Relative: 0 %
Eosinophils Absolute: 0.1 10*3/uL (ref 0–0.7)
Eosinophils Relative: 1 %
HEMATOCRIT: 31.5 % — AB (ref 35.0–47.0)
HEMOGLOBIN: 10.8 g/dL — AB (ref 12.0–16.0)
LYMPHS PCT: 15 %
Lymphs Abs: 0.9 10*3/uL — ABNORMAL LOW (ref 1.0–3.6)
MCH: 30.8 pg (ref 26.0–34.0)
MCHC: 34.3 g/dL (ref 32.0–36.0)
MCV: 89.9 fL (ref 80.0–100.0)
MONO ABS: 0.6 10*3/uL (ref 0.2–0.9)
Monocytes Relative: 11 %
NEUTROS ABS: 4.4 10*3/uL (ref 1.4–6.5)
NEUTROS PCT: 73 %
Platelets: 234 10*3/uL (ref 150–440)
RBC: 3.5 MIL/uL — ABNORMAL LOW (ref 3.80–5.20)
RDW: 13.5 % (ref 11.5–14.5)
WBC: 6 10*3/uL (ref 3.6–11.0)

## 2015-10-25 LAB — IRON AND TIBC
IRON: 39 ug/dL (ref 28–170)
SATURATION RATIOS: 12 % (ref 10.4–31.8)
TIBC: 322 ug/dL (ref 250–450)
UIBC: 283 ug/dL

## 2015-10-25 LAB — FERRITIN: FERRITIN: 376 ng/mL — AB (ref 11–307)

## 2015-10-25 NOTE — Progress Notes (Signed)
[facility] OFFICE PROGRESS NOTE  Patient Care Team: Dion Body, MD as PCP - General (Family Medicine)   SUMMARY OF ONCOLOGIC HISTORY:  # 2012- DCIS of RIGHT BREAST s/p Lumpec  [Dr.Crystal]; Jan 2013- START Tam; April 2017-STOP TAMOXIFEN  # July 2015  Anemia mild [~11]  INTERVAL HISTORY:  This is my first interaction with the patient since I joined the practice September 2016. I reviewed the patient's prior charts/pertinent labs/imaging in detail; findings are summarized above.   82 year old very pleasant Caucasian female patient with above history of DCIS is here for follow-up.  Patient's appetite is good. Denies any weight loss. Denies any lumps or bumps. No nausea no vomiting. No blood in stools black stools. She had colonoscopy many years ago.  Patient does have hot flashes. She history of fall for which she has arthritis this is not new. She walks with a cane. She is concerned about the blood clots and strokes from tamoxifen.   REVIEW OF SYSTEMS:  A complete 10 point review of system is done which is negative except mentioned above/history of present illness.   PAST MEDICAL HISTORY :  Past Medical History  Diagnosis Date  . Hypertension   . Heart murmur   . Breast cancer ([place]) 2012    right breast with lumpectomy and mammosite rad tx    PAST SURGICAL HISTORY :   Past Surgical History  Procedure Laterality Date  . Breast biopsy Right 2012    positve  . Breast biopsy Right 2012    negative  . Breast lumpectomy    . Partial hysterectomy  1978    FAMILY HISTORY :  No family history on file.  SOCIAL HISTORY:   Social History  Substance Use Topics  . Smoking status: Not on file  . Smokeless tobacco: Not on file  . Alcohol Use: Not on file    ALLERGIES:  is allergic to ciprofloxacin.  MEDICATIONS:  Current Outpatient Prescriptions  Medication Sig Dispense Refill  . aspirin EC 81 MG tablet Take by mouth.    . Cholecalciferol (VITAMIN  D-1000 MAX ST) 1000 UNITS tablet Take by mouth.    . ferrous sulfate 325 (65 FE) MG tablet Take by mouth.    Marland Kitchen ibuprofen (ADVIL,MOTRIN) 200 MG tablet Take by mouth.    Marland Kitchen lisinopril (PRINIVIL,ZESTRIL) 10 MG tablet TAKE 1 TABLET BY MOUTH EVERY DAY    . tamoxifen (NOLVADEX) 20 MG tablet Take 1 tablet (20 mg total) by mouth daily. 90 tablet 3  . tamoxifen (NOLVADEX) 20 MG tablet Take by mouth.    . traMADol (ULTRAM) 50 MG tablet Take 50 mg by mouth every 6 (six) hours as needed.     No current facility-administered medications for this visit.    PHYSICAL EXAMINATION:   BP 155/66 mmHg  Pulse 77  Temp(Src) 97 F (36.1 C) (Tympanic)  Wt 127 lb 13.9 oz (58 kg)  Filed Weights   10/25/15 1101  Weight: 127 lb 13.9 oz (58 kg)    GENERAL: Well-nourished well-developed; Alert, no distress and comfortable.  Alone. She walks with a cane. EYES: no pallor or icterus OROPHARYNX: no thrush or ulceration; good dentition  NECK: supple, no masses felt LYMPH:  no palpable lymphadenopathy in the cervical, axillary or inguinal regions LUNGS: clear to auscultation and  No wheeze or crackles HEART/CVS: regular rate & rhythm and no murmurs; No lower extremity edema ABDOMEN:abdomen soft, non-tender and normal bowel sounds Musculoskeletal:no cyanosis of digits and no clubbing  PSYCH:  alert & oriented x 3 with fluent speech NEURO: no focal motor/sensory deficits SKIN:  no rashes or significant lesions Right and left BREAST exam [in the presence of nurse]- no unusual skin changes or dominant masses felt. Telangiectasia from radiation noted. Surgical scars noted.    LABORATORY DATA:  I have reviewed the data as listed    Component Value Date/Time   NA 128* 10/25/2015 1019   NA 139 07/17/2011 1003   K 4.2 10/25/2015 1019   K 4.0 07/17/2011 1003   CL 102 10/25/2015 1019   CL 102 07/17/2011 1003   CO2 23 10/25/2015 1019   CO2 31 07/17/2011 1003   GLUCOSE 113* 10/25/2015 1019   GLUCOSE 117* 07/17/2011  1003   BUN 15 10/25/2015 1019   BUN 17 07/17/2011 1003   CREATININE 0.62 10/25/2015 1019   CREATININE 0.61 10/24/2014 1039   CALCIUM 8.5* 10/25/2015 1019   CALCIUM 9.9 07/17/2011 1003   PROT 6.8 10/25/2015 1019   PROT 6.4* 10/24/2014 1039   ALBUMIN 4.1 10/25/2015 1019   ALBUMIN 3.6 10/24/2014 1039   AST 19 10/25/2015 1019   AST 15 10/24/2014 1039   ALT 16 10/25/2015 1019   ALT 13* 10/24/2014 1039   ALKPHOS 134* 10/25/2015 1019   ALKPHOS 37* 10/24/2014 1039   BILITOT 0.4 10/25/2015 1019   BILITOT 0.5 10/24/2014 1039   GFRNONAA >60 10/25/2015 1019   GFRNONAA >60 10/24/2014 1039   GFRNONAA >60 07/17/2011 1003   GFRAA >60 10/25/2015 1019   GFRAA >60 10/24/2014 1039   GFRAA >60 07/17/2011 1003    No results found for: SPEP, UPEP  Lab Results  Component Value Date   WBC 6.0 10/25/2015   NEUTROABS 4.4 10/25/2015   HGB 10.8* 10/25/2015   HCT 31.5* 10/25/2015   MCV 89.9 10/25/2015   PLT 234 10/25/2015      Chemistry      Component Value Date/Time   NA 128* 10/25/2015 1019   NA 139 07/17/2011 1003   K 4.2 10/25/2015 1019   K 4.0 07/17/2011 1003   CL 102 10/25/2015 1019   CL 102 07/17/2011 1003   CO2 23 10/25/2015 1019   CO2 31 07/17/2011 1003   BUN 15 10/25/2015 1019   BUN 17 07/17/2011 1003   CREATININE 0.62 10/25/2015 1019   CREATININE 0.61 10/24/2014 1039      Component Value Date/Time   CALCIUM 8.5* 10/25/2015 1019   CALCIUM 9.9 07/17/2011 1003   ALKPHOS 134* 10/25/2015 1019   ALKPHOS 37* 10/24/2014 1039   AST 19 10/25/2015 1019   AST 15 10/24/2014 1039   ALT 16 10/25/2015 1019   ALT 13* 10/24/2014 1039   BILITOT 0.4 10/25/2015 1019   BILITOT 0.5 10/24/2014 1039        ASSESSMENT & PLAN:   # DCIS of RIGHT BREAST s/p Lumpec  [Dr.Crystal]- Recent mammogram November 2016 with normal limits. Clinically no evidence of recurrence. Discussed regarding tamoxifen patient concerned about the potential side effects of strokes; I think it's reasonable to stop  tamoxifen at this time.  # Anemia- hemoglobin-10.8;Reviewed hemoglobin in care everywhere- anemia chronic dating back to July 2015 when the hemoglobin was 11.9. no clear etiology recommend iron studies stool occult card. If iron deficiency recommend colonoscopy/we will check with PCP. Otherwise continue monitoring for now.  # Hyponatremia sodium 128; otherwise rest of the kidney numbers are within normal limits. I reviewed the labs from [facility] from January 2017. Sodium was 136. This likely needs to  be repeated/ will defer to PCP.   # Otherwise patient will follow-up with [place] in one year with CBC CMP.    Cammie Sickle, MD 10/25/2015 11:26 AM

## 2015-10-25 NOTE — Progress Notes (Signed)
Patient ambulates with a cane, brought to exam room 4.  Vitals documented, medication record updated, information provided by patient.

## 2015-10-27 DIAGNOSIS — Z853 Personal history of malignant neoplasm of breast: Secondary | ICD-10-CM | POA: Diagnosis not present

## 2015-10-28 ENCOUNTER — Other Ambulatory Visit: Payer: Self-pay | Admitting: *Deleted

## 2015-10-28 DIAGNOSIS — Z853 Personal history of malignant neoplasm of breast: Secondary | ICD-10-CM | POA: Diagnosis not present

## 2015-10-28 DIAGNOSIS — D649 Anemia, unspecified: Secondary | ICD-10-CM

## 2015-10-28 DIAGNOSIS — C50911 Malignant neoplasm of unspecified site of right female breast: Secondary | ICD-10-CM

## 2015-10-28 LAB — OCCULT BLOOD X 1 CARD TO LAB, STOOL
Fecal Occult Bld: NEGATIVE
Fecal Occult Bld: POSITIVE — AB

## 2015-10-29 ENCOUNTER — Telehealth: Payer: Self-pay | Admitting: *Deleted

## 2015-10-29 NOTE — Telephone Encounter (Signed)
Called and spoke with patient. Explained her stool card results came back positive for blood in her stools. Explained to patient that Dr. Rogue Bussing would like for her to call and follow up with her PCP for a possible colonoscopy. Melinda Little stated understanding and that she would call Dr. Raylene Miyamoto office.

## 2015-10-29 NOTE — Telephone Encounter (Signed)
-----   Message from Cammie Sickle, MD sent at 10/29/2015  8:22 AM EDT ----- Please inform patient that her stool occult is positive. She needs to call her PCP for possible colonoscopy. Thx

## 2015-10-29 NOTE — Progress Notes (Signed)
Please inform patient that her stool occult is positive. She needs to call her PCP for possible colonoscopy. Dr.B

## 2015-10-29 NOTE — Telephone Encounter (Signed)
Left message for patient to call [facility] to discuss results.

## 2016-04-02 ENCOUNTER — Telehealth: Payer: Self-pay | Admitting: *Deleted

## 2016-04-02 NOTE — Telephone Encounter (Signed)
Spoke with patient regarding appointment change.  Appointment rescheduled from 05/01/16 to 05/06/16 @ 10am.  Patient repeated back the date and time

## 2016-05-01 ENCOUNTER — Ambulatory Visit: Payer: Medicare Other | Admitting: Radiation Oncology

## 2016-05-05 ENCOUNTER — Ambulatory Visit
Admission: RE | Admit: 2016-05-05 | Discharge: 2016-05-05 | Disposition: A | Discharge status: Home / Self Care | Payer: Medicare Other | Source: Ambulatory Visit | Attending: Surgery | Admitting: Surgery

## 2016-05-05 ENCOUNTER — Encounter
Admission: RE | Admit: 2016-05-05 | Discharge: 2016-05-05 | Disposition: A | Discharge status: Home / Self Care | Payer: Medicare Other | Source: Ambulatory Visit | Attending: Surgery | Admitting: Surgery

## 2016-05-05 ENCOUNTER — Other Ambulatory Visit: Payer: Self-pay

## 2016-05-05 DIAGNOSIS — Z01810 Encounter for preprocedural cardiovascular examination: Secondary | ICD-10-CM | POA: Diagnosis not present

## 2016-05-05 DIAGNOSIS — Z01818 Encounter for other preprocedural examination: Secondary | ICD-10-CM

## 2016-05-05 DIAGNOSIS — J449 Chronic obstructive pulmonary disease, unspecified: Secondary | ICD-10-CM | POA: Insufficient documentation

## 2016-05-05 DIAGNOSIS — Z01812 Encounter for preprocedural laboratory examination: Secondary | ICD-10-CM | POA: Diagnosis not present

## 2016-05-05 HISTORY — DX: Prediabetes: R73.03

## 2016-05-05 HISTORY — DX: Anemia, unspecified: D64.9

## 2016-05-05 LAB — URINALYSIS COMPLETE WITH MICROSCOPIC (ARMC ONLY)
BACTERIA UA: NONE SEEN
Bilirubin Urine: NEGATIVE
GLUCOSE, UA: NEGATIVE mg/dL
HGB URINE DIPSTICK: NEGATIVE
Ketones, ur: NEGATIVE mg/dL
LEUKOCYTES UA: NEGATIVE
NITRITE: NEGATIVE
PH: 6 (ref 5.0–8.0)
PROTEIN: NEGATIVE mg/dL
SPECIFIC GRAVITY, URINE: 1.011 (ref 1.005–1.030)

## 2016-05-05 LAB — CBC
HCT: 35.5 % (ref 35.0–47.0)
HEMOGLOBIN: 12.2 g/dL (ref 12.0–16.0)
MCH: 31.3 pg (ref 26.0–34.0)
MCHC: 34.4 g/dL (ref 32.0–36.0)
MCV: 91.2 fL (ref 80.0–100.0)
PLATELETS: 186 10*3/uL (ref 150–440)
RBC: 3.89 MIL/uL (ref 3.80–5.20)
RDW: 13.2 % (ref 11.5–14.5)
WBC: 5.6 10*3/uL (ref 3.6–11.0)

## 2016-05-05 LAB — SURGICAL PCR SCREEN
MRSA, PCR: NEGATIVE
STAPHYLOCOCCUS AUREUS: NEGATIVE

## 2016-05-05 LAB — BASIC METABOLIC PANEL
Anion gap: 6 (ref 5–15)
BUN: 16 mg/dL (ref 6–20)
CHLORIDE: 103 mmol/L (ref 101–111)
CO2: 24 mmol/L (ref 22–32)
CREATININE: 0.65 mg/dL (ref 0.44–1.00)
Calcium: 9.1 mg/dL (ref 8.9–10.3)
Glucose, Bld: 99 mg/dL (ref 65–99)
Potassium: 4.5 mmol/L (ref 3.5–5.1)
SODIUM: 133 mmol/L — AB (ref 135–145)

## 2016-05-05 LAB — TYPE AND SCREEN
ABO/RH(D): A NEG
Antibody Screen: NEGATIVE

## 2016-05-05 LAB — PROTIME-INR
INR: 0.96
PROTHROMBIN TIME: 12.8 s (ref 11.4–15.2)

## 2016-05-05 NOTE — Patient Instructions (Signed)
Your procedure is scheduled on: 05/19/16 Report to Day Surgery. 2nd floor medical mall entrance To find out your arrival time please call [phone] between 1PM - 3PM on 05/18/16.  Remember: Instructions that are not followed completely may result in serious medical risk, up to and including death, or upon the discretion of your surgeon and anesthesiologist your surgery may need to be rescheduled.    __X__ 1. Do not eat food or drink liquids after midnight. No gum chewing or hard candies.     __X__ 2. No Alcohol for 24 hours before or after surgery.   ____ 3. Bring all medications with you on the day of surgery if instructed.    __X__ 4. Notify your doctor if there is any change in your medical condition     (cold, fever, infections).     Do not wear jewelry, make-up, hairpins, clips or nail polish.  Do not wear lotions, powders, or perfumes.   Do not shave 48 hours prior to surgery. Men may shave face and neck.  Do not bring valuables to the hospital.    [facility] is not responsible for any belongings or valuables.               Contacts, dentures or bridgework may not be worn into surgery.  Leave your suitcase in the car. After surgery it may be brought to your room.  For patients admitted to the hospital, discharge time is determined by your                treatment team.   Patients discharged the day of surgery will not be allowed to drive home.   Please read over the following fact sheets that you were given:   Pain Booklet and MRSA Information   __x__ Take these medicines the morning of surgery with A SIP OF WATER:    1. lisinopril  2.   3.   4.  5.  6.  ____ Fleet Enema (as directed)   __x__ Use CHG Soap as directed  ____ Use inhalers on the day of surgery  ____ Stop metformin 2 days prior to surgery    ____ Take 1/2 of usual insulin dose the night before surgery and none on the morning of surgery.   __x__ Stop Coumadin/Plavix/aspirin on 7 days before  surgery 05/12/16 __x__ Stop Anti-inflammatories on 7 days before surgery (ibuprofen) 05/12/16   ____ Stop supplements until after surgery.    ____ Bring C-Pap to the hospital.

## 2016-05-06 ENCOUNTER — Encounter: Payer: Self-pay | Admitting: Radiation Oncology

## 2016-05-06 ENCOUNTER — Ambulatory Visit
Admission: RE | Admit: 2016-05-06 | Discharge: 2016-05-06 | Disposition: A | Discharge status: Home / Self Care | Payer: Medicare Other | Source: Ambulatory Visit | Attending: Radiation Oncology | Admitting: Radiation Oncology

## 2016-05-06 VITALS — BP 160/72 | HR 76 | Temp 96.9°F | Resp 20 | Wt 128.3 lb

## 2016-05-06 DIAGNOSIS — Z853 Personal history of malignant neoplasm of breast: Secondary | ICD-10-CM | POA: Insufficient documentation

## 2016-05-06 DIAGNOSIS — C50511 Malignant neoplasm of lower-outer quadrant of right female breast: Secondary | ICD-10-CM

## 2016-05-06 DIAGNOSIS — Z923 Personal history of irradiation: Secondary | ICD-10-CM | POA: Insufficient documentation

## 2016-05-06 DIAGNOSIS — Z170 Estrogen receptor positive status [ER+]: Secondary | ICD-10-CM

## 2016-05-06 NOTE — Addendum Note (Signed)
Encounter addended by: Noreene Filbert, MD on: 05/06/2016 11:57 AM<BR>    Actions taken: LOS modified, Follow-up modified

## 2016-05-06 NOTE — Progress Notes (Signed)
Radiation Oncology Follow up Note  Name: Melinda Little   Date:   05/06/2016 MRN:  [id number] DOB: [birth year]    This 82 y.o. female presents to the clinic today for 5 year follow-up for adjuvant whole breast radiation to her right breast for ductal carcinoma in situ ER/PR positive.  REFERRING PROVIDER: Dion Body, MD  HPI: Patient is an 82 year old female now out 5 years having completed radiation therapy to her right breast for ductal carcinoma in situ ER/PR positive. She is seen today in routine follow-up and is doing well. She specifically denies breast tenderness cough or bone pain.Marland Kitchen Her last mammogram back in January 2017 was BI-RADS 2 benign she scheduled for repeat in January. She is no longer on anti-estrogen therapy.  COMPLICATIONS OF TREATMENT: none  FOLLOW UP COMPLIANCE: keeps appointments   PHYSICAL EXAM:  BP (!) 160/72   Pulse 76   Temp (!) 96.9 F (36.1 C)   Resp 20   Wt 128 lb 4.9 oz (58.2 kg)   BMI 22.02 kg/m  Lungs are clear to A&P cardiac examination essentially unremarkable with regular rate and rhythm. No dominant mass or nodularity is noted in either breast in 2 positions examined. Incision is well-healed. No axillary or supraclavicular adenopathy is appreciated. Cosmetic result is excellent. Well-developed well-nourished patient in NAD. HEENT reveals PERLA, EOMI, discs not visualized.  Oral cavity is clear. No oral mucosal lesions are identified. Neck is clear without evidence of cervical or supraclavicular adenopathy. Lungs are clear to A&P. Cardiac examination is essentially unremarkable with regular rate and rhythm without murmur rub or thrill. Abdomen is benign with no organomegaly or masses noted. Motor sensory and DTR levels are equal and symmetric in the upper and lower extremities. Cranial nerves II through XII are grossly intact. Proprioception is intact. No peripheral adenopathy or edema is identified. No motor or sensory levels are noted. Crude  visual fields are within normal range.  RADIOLOGY RESULTS: Last mammograms reviewed and compatible with the above-stated findings  PLAN: At the present time she is doing well with no evidence of disease. I'm please were overall progress. Based on her being out 5 years I'm going to discontinue follow-up care. She will continue have mammograms as scheduled. Patient knows to call with any concerns.  I would like to take this opportunity to thank you for allowing me to participate in the care of your patient.Armstead Peaks., MD

## 2016-05-19 ENCOUNTER — Inpatient Hospital Stay
Admission: RE | Admit: 2016-05-19 | Discharge: 2016-05-22 | DRG: 470 | Disposition: A | Discharge status: Home Health | Payer: Medicare Other | Source: Ambulatory Visit | Attending: Surgery | Admitting: Surgery

## 2016-05-19 ENCOUNTER — Inpatient Hospital Stay: Payer: Medicare Other | Admitting: Anesthesiology

## 2016-05-19 ENCOUNTER — Inpatient Hospital Stay: Payer: Medicare Other

## 2016-05-19 ENCOUNTER — Encounter
Admission: RE | Disposition: A | Discharge status: Home Health | Payer: Self-pay | Source: Ambulatory Visit | Attending: Surgery

## 2016-05-19 DIAGNOSIS — M8580 Other specified disorders of bone density and structure, unspecified site: Secondary | ICD-10-CM | POA: Diagnosis present

## 2016-05-19 DIAGNOSIS — Z87891 Personal history of nicotine dependence: Secondary | ICD-10-CM | POA: Diagnosis not present

## 2016-05-19 DIAGNOSIS — Z8249 Family history of ischemic heart disease and other diseases of the circulatory system: Secondary | ICD-10-CM

## 2016-05-19 DIAGNOSIS — Z96652 Presence of left artificial knee joint: Secondary | ICD-10-CM | POA: Diagnosis present

## 2016-05-19 DIAGNOSIS — M6281 Muscle weakness (generalized): Secondary | ICD-10-CM

## 2016-05-19 DIAGNOSIS — Z853 Personal history of malignant neoplasm of breast: Secondary | ICD-10-CM

## 2016-05-19 DIAGNOSIS — M25561 Pain in right knee: Secondary | ICD-10-CM | POA: Diagnosis present

## 2016-05-19 DIAGNOSIS — R7303 Prediabetes: Secondary | ICD-10-CM | POA: Diagnosis present

## 2016-05-19 DIAGNOSIS — Z96651 Presence of right artificial knee joint: Secondary | ICD-10-CM

## 2016-05-19 DIAGNOSIS — D649 Anemia, unspecified: Secondary | ICD-10-CM | POA: Diagnosis present

## 2016-05-19 DIAGNOSIS — R011 Cardiac murmur, unspecified: Secondary | ICD-10-CM | POA: Diagnosis present

## 2016-05-19 DIAGNOSIS — Z881 Allergy status to other antibiotic agents status: Secondary | ICD-10-CM | POA: Diagnosis not present

## 2016-05-19 DIAGNOSIS — R110 Nausea: Secondary | ICD-10-CM | POA: Diagnosis not present

## 2016-05-19 DIAGNOSIS — R509 Fever, unspecified: Secondary | ICD-10-CM | POA: Diagnosis not present

## 2016-05-19 DIAGNOSIS — Z66 Do not resuscitate: Secondary | ICD-10-CM | POA: Diagnosis present

## 2016-05-19 DIAGNOSIS — R262 Difficulty in walking, not elsewhere classified: Secondary | ICD-10-CM

## 2016-05-19 DIAGNOSIS — I10 Essential (primary) hypertension: Secondary | ICD-10-CM | POA: Diagnosis present

## 2016-05-19 DIAGNOSIS — M1711 Unilateral primary osteoarthritis, right knee: Secondary | ICD-10-CM | POA: Diagnosis present

## 2016-05-19 HISTORY — PX: TOTAL KNEE ARTHROPLASTY: SHX125

## 2016-05-19 LAB — ABO/RH: ABO/RH(D): A NEG

## 2016-05-19 LAB — GLUCOSE, CAPILLARY: Glucose-Capillary: 95 mg/dL (ref 65–99)

## 2016-05-19 SURGERY — ARTHROPLASTY, KNEE, TOTAL
Anesthesia: Spinal | Site: Knee | Laterality: Right | Wound class: Clean

## 2016-05-19 MED ORDER — OXYCODONE HCL 5 MG PO TABS
5.0000 mg | ORAL_TABLET | ORAL | Status: DC | PRN
  Administered 2016-05-19: 5 mg via ORAL
  Administered 2016-05-20: 10 mg via ORAL
  Administered 2016-05-20: 5 mg via ORAL
  Administered 2016-05-20 (×2): 10 mg via ORAL
  Filled 2016-05-19: qty 2
  Filled 2016-05-19 (×2): qty 1
  Filled 2016-05-19 (×2): qty 2

## 2016-05-19 MED ORDER — NEOMYCIN-POLYMYXIN B GU 40-200000 IR SOLN
Status: DC | PRN
  Administered 2016-05-19: 14 mL

## 2016-05-19 MED ORDER — ONDANSETRON HCL 4 MG/2ML IJ SOLN: 4.0000 mg | Freq: Once | INTRAMUSCULAR | Status: DC | PRN

## 2016-05-19 MED ORDER — FERROUS SULFATE 325 (65 FE) MG PO TABS
325.0000 mg | ORAL_TABLET | Freq: Two times a day (BID) | ORAL | Status: DC
  Administered 2016-05-19 – 2016-05-22 (×6): 325 mg via ORAL
  Filled 2016-05-19 (×6): qty 1

## 2016-05-19 MED ORDER — BUPIVACAINE-EPINEPHRINE (PF) 0.5% -1:200000 IJ SOLN
INTRAMUSCULAR | Status: DC | PRN
  Administered 2016-05-19: 30 mL via PERINEURAL

## 2016-05-19 MED ORDER — DOCUSATE SODIUM 100 MG PO CAPS
100.0000 mg | ORAL_CAPSULE | Freq: Two times a day (BID) | ORAL | Status: DC
  Administered 2016-05-19 – 2016-05-22 (×7): 100 mg via ORAL
  Filled 2016-05-19 (×7): qty 1

## 2016-05-19 MED ORDER — TRANEXAMIC ACID 1000 MG/10ML IV SOLN
INTRAVENOUS | Status: DC | PRN
  Administered 2016-05-19: 1000 mg via INTRAVENOUS

## 2016-05-19 MED ORDER — FLEET ENEMA 7-19 GM/118ML RE ENEM: 1.0000 | ENEMA | Freq: Once | RECTAL | Status: DC | PRN

## 2016-05-19 MED ORDER — MAGNESIUM HYDROXIDE 400 MG/5ML PO SUSP
30.0000 mL | Freq: Every day | ORAL | Status: DC | PRN
  Administered 2016-05-21: 30 mL via ORAL
  Filled 2016-05-19: qty 30

## 2016-05-19 MED ORDER — ASPIRIN EC 81 MG PO TBEC
81.0000 mg | DELAYED_RELEASE_TABLET | Freq: Every day | ORAL | Status: DC
Start: 2016-05-19 — End: 2016-05-22
  Administered 2016-05-19 – 2016-05-22 (×4): 81 mg via ORAL
  Filled 2016-05-19 (×4): qty 1

## 2016-05-19 MED ORDER — LACTATED RINGERS IV SOLN: INTRAVENOUS | Status: DC

## 2016-05-19 MED ORDER — CEFAZOLIN SODIUM-DEXTROSE 2-4 GM/100ML-% IV SOLN
2.0000 g | Freq: Once | INTRAVENOUS | Status: AC
  Administered 2016-05-19: 2 g via INTRAVENOUS

## 2016-05-19 MED ORDER — CEFAZOLIN SODIUM-DEXTROSE 2-4 GM/100ML-% IV SOLN
INTRAVENOUS | Status: AC
  Filled 2016-05-19: qty 100

## 2016-05-19 MED ORDER — SODIUM CHLORIDE 0.9 % IV SOLN
INTRAVENOUS | Status: DC
  Administered 2016-05-19 (×2): via INTRAVENOUS

## 2016-05-19 MED ORDER — ACETAMINOPHEN 325 MG PO TABS: 650.0000 mg | ORAL_TABLET | Freq: Four times a day (QID) | ORAL | Status: DC | PRN

## 2016-05-19 MED ORDER — BUPIVACAINE HCL (PF) 0.5 % IJ SOLN
INTRAMUSCULAR | Status: DC | PRN
  Administered 2016-05-19: 3 mL via INTRATHECAL

## 2016-05-19 MED ORDER — BUPIVACAINE LIPOSOME 1.3 % IJ SUSP
INTRAMUSCULAR | Status: AC
  Filled 2016-05-19: qty 20

## 2016-05-19 MED ORDER — PROPOFOL 500 MG/50ML IV EMUL
INTRAVENOUS | Status: DC | PRN
  Administered 2016-05-19: 50 ug/kg/min via INTRAVENOUS

## 2016-05-19 MED ORDER — METOCLOPRAMIDE HCL 10 MG PO TABS: 5.0000 mg | ORAL_TABLET | Freq: Three times a day (TID) | ORAL | Status: DC | PRN

## 2016-05-19 MED ORDER — TRAMADOL HCL 50 MG PO TABS
50.0000 mg | ORAL_TABLET | Freq: Four times a day (QID) | ORAL | Status: DC | PRN
  Administered 2016-05-19: 100 mg via ORAL
  Administered 2016-05-20 – 2016-05-22 (×3): 50 mg via ORAL
  Filled 2016-05-19: qty 1
  Filled 2016-05-19: qty 2
  Filled 2016-05-19 (×2): qty 1

## 2016-05-19 MED ORDER — SODIUM CHLORIDE 0.9 % IV SOLN
INTRAVENOUS | Status: DC | PRN
  Administered 2016-05-19: 60 mL

## 2016-05-19 MED ORDER — PROPOFOL 10 MG/ML IV BOLUS
INTRAVENOUS | Status: DC | PRN
  Administered 2016-05-19: 20 mg via INTRAVENOUS

## 2016-05-19 MED ORDER — BISACODYL 10 MG RE SUPP: 10.0000 mg | Freq: Every day | RECTAL | Status: DC | PRN

## 2016-05-19 MED ORDER — ACETAMINOPHEN 650 MG RE SUPP: 650.0000 mg | Freq: Four times a day (QID) | RECTAL | Status: DC | PRN

## 2016-05-19 MED ORDER — FAMOTIDINE 20 MG PO TABS
ORAL_TABLET | ORAL | Status: AC
  Administered 2016-05-19: 20 mg via ORAL
  Filled 2016-05-19: qty 1

## 2016-05-19 MED ORDER — ONDANSETRON HCL 4 MG/2ML IJ SOLN: 4.0000 mg | Freq: Four times a day (QID) | INTRAMUSCULAR | Status: DC | PRN

## 2016-05-19 MED ORDER — ONDANSETRON HCL 4 MG PO TABS: 4.0000 mg | ORAL_TABLET | Freq: Four times a day (QID) | ORAL | Status: DC | PRN

## 2016-05-19 MED ORDER — DIPHENHYDRAMINE HCL 12.5 MG/5ML PO ELIX: 12.5000 mg | ORAL_SOLUTION | ORAL | Status: DC | PRN

## 2016-05-19 MED ORDER — HYDROMORPHONE HCL 1 MG/ML IJ SOLN
0.5000 mg | INTRAMUSCULAR | Status: DC | PRN
  Administered 2016-05-19: 0.5 mg via INTRAVENOUS
  Administered 2016-05-19: 1 mg via INTRAVENOUS
  Administered 2016-05-19 – 2016-05-21 (×5): 0.5 mg via INTRAVENOUS
  Filled 2016-05-19 (×7): qty 1

## 2016-05-19 MED ORDER — NEOMYCIN-POLYMYXIN B GU 40-200000 IR SOLN
Status: AC
  Filled 2016-05-19: qty 20

## 2016-05-19 MED ORDER — CEFAZOLIN SODIUM-DEXTROSE 2-4 GM/100ML-% IV SOLN
2.0000 g | Freq: Four times a day (QID) | INTRAVENOUS | Status: AC
  Administered 2016-05-19 – 2016-05-20 (×3): 2 g via INTRAVENOUS
  Filled 2016-05-19 (×3): qty 100

## 2016-05-19 MED ORDER — ENOXAPARIN SODIUM 30 MG/0.3ML ~~LOC~~ SOLN
30.0000 mg | Freq: Two times a day (BID) | SUBCUTANEOUS | Status: DC
  Administered 2016-05-19 – 2016-05-22 (×7): 30 mg via SUBCUTANEOUS
  Filled 2016-05-19 (×7): qty 0.3

## 2016-05-19 MED ORDER — LISINOPRIL 10 MG PO TABS
10.0000 mg | ORAL_TABLET | Freq: Every day | ORAL | Status: DC
  Administered 2016-05-20 – 2016-05-22 (×3): 10 mg via ORAL
  Filled 2016-05-19 (×3): qty 1

## 2016-05-19 MED ORDER — PHENYLEPHRINE HCL 10 MG/ML IJ SOLN
INTRAMUSCULAR | Status: DC | PRN
  Administered 2016-05-19: 100 ug via INTRAVENOUS

## 2016-05-19 MED ORDER — SODIUM CHLORIDE 0.9 % IJ SOLN
INTRAMUSCULAR | Status: AC
  Filled 2016-05-19: qty 50

## 2016-05-19 MED ORDER — TRANEXAMIC ACID 1000 MG/10ML IV SOLN
INTRAVENOUS | Status: AC
  Filled 2016-05-19: qty 10

## 2016-05-19 MED ORDER — KCL IN DEXTROSE-NACL 20-5-0.9 MEQ/L-%-% IV SOLN
INTRAVENOUS | Status: DC
  Administered 2016-05-19 – 2016-05-20 (×2): via INTRAVENOUS
  Filled 2016-05-19 (×7): qty 1000

## 2016-05-19 MED ORDER — NEOMYCIN-POLYMYXIN B GU 40-200000 IR SOLN
Status: AC
  Filled 2016-05-19: qty 2

## 2016-05-19 MED ORDER — METOCLOPRAMIDE HCL 5 MG/ML IJ SOLN: 5.0000 mg | Freq: Three times a day (TID) | INTRAMUSCULAR | Status: DC | PRN

## 2016-05-19 MED ORDER — FAMOTIDINE 20 MG PO TABS
20.0000 mg | ORAL_TABLET | Freq: Once | ORAL | Status: AC
  Administered 2016-05-19: 20 mg via ORAL

## 2016-05-19 MED ORDER — LIDOCAINE HCL (PF) 2 % IJ SOLN
INTRAMUSCULAR | Status: DC | PRN
  Administered 2016-05-19: 50 mg

## 2016-05-19 MED ORDER — FENTANYL CITRATE (PF) 100 MCG/2ML IJ SOLN
INTRAMUSCULAR | Status: DC | PRN
  Administered 2016-05-19: 50 ug via INTRAVENOUS

## 2016-05-19 MED ORDER — EPHEDRINE SULFATE 50 MG/ML IJ SOLN
INTRAMUSCULAR | Status: DC | PRN
Start: 2016-05-19 — End: 2016-05-19
  Administered 2016-05-19 (×2): 5 mg via INTRAVENOUS

## 2016-05-19 MED ORDER — ACETAMINOPHEN 500 MG PO TABS
1000.0000 mg | ORAL_TABLET | Freq: Four times a day (QID) | ORAL | Status: AC
  Administered 2016-05-19 – 2016-05-20 (×3): 1000 mg via ORAL
  Filled 2016-05-19 (×3): qty 2

## 2016-05-19 MED ORDER — FENTANYL CITRATE (PF) 100 MCG/2ML IJ SOLN: 25.0000 ug | INTRAMUSCULAR | Status: DC | PRN

## 2016-05-19 MED ORDER — CHOLECALCIFEROL 10 MCG (400 UNIT) PO TABS
400.0000 [IU] | ORAL_TABLET | Freq: Every day | ORAL | Status: DC
  Administered 2016-05-19 – 2016-05-22 (×4): 400 [IU] via ORAL
  Filled 2016-05-19 (×4): qty 1

## 2016-05-19 MED ORDER — BUPIVACAINE-EPINEPHRINE (PF) 0.5% -1:200000 IJ SOLN
INTRAMUSCULAR | Status: AC
  Filled 2016-05-19: qty 30

## 2016-05-19 SURGICAL SUPPLY — 60 items
BANDAGE ELASTIC 6 CLIP ST LF (GAUZE/BANDAGES/DRESSINGS) ×3 IMPLANT
BLADE SAW SAG 25X90X1.19 (BLADE) ×3 IMPLANT
BLADE SURG SZ20 CARB STEEL (BLADE) ×3 IMPLANT
BNDG COHESIVE 6X5 TAN STRL LF (GAUZE/BANDAGES/DRESSINGS) ×3 IMPLANT
BONE CEMENT PALACOSE (Orthopedic Implant) ×6 IMPLANT
CANISTER SUCT 1200ML W/VALVE (MISCELLANEOUS) ×3 IMPLANT
CANISTER SUCT 3000ML (MISCELLANEOUS) ×3 IMPLANT
CAPT KNEE TOTAL 3 ×3 IMPLANT
CATH TRAY METER 16FR LF (MISCELLANEOUS) ×3 IMPLANT
CEMENT BONE PALACOSE (Orthopedic Implant) ×2 IMPLANT
CHLORAPREP W/TINT 26ML (MISCELLANEOUS) ×3 IMPLANT
COMPACT MIXING SYSTEM ×3 IMPLANT
COOLER POLAR GLACIER W/PUMP (MISCELLANEOUS) ×3 IMPLANT
COVER MAYO STAND STRL (DRAPES) ×3 IMPLANT
CUFF TOURN 24 STER (MISCELLANEOUS) IMPLANT
CUFF TOURN 30 STER DUAL PORT (MISCELLANEOUS) IMPLANT
DECANTER SPIKE VIAL GLASS SM (MISCELLANEOUS) ×9 IMPLANT
DRAPE IMP U-DRAPE 54X76 (DRAPES) ×3 IMPLANT
DRAPE INCISE IOBAN 66X45 STRL (DRAPES) ×3 IMPLANT
DRAPE SHEET LG 3/4 BI-LAMINATE (DRAPES) ×3 IMPLANT
DRSG OPSITE POSTOP 4X10 (GAUZE/BANDAGES/DRESSINGS) ×3 IMPLANT
DRSG OPSITE POSTOP 4X12 (GAUZE/BANDAGES/DRESSINGS) IMPLANT
DRSG OPSITE POSTOP 4X14 (GAUZE/BANDAGES/DRESSINGS) IMPLANT
ELECT CAUTERY BLADE 6.4 (BLADE) ×3 IMPLANT
ELECT REM PT RETURN 9FT ADLT (ELECTROSURGICAL) ×3
ELECTRODE REM PT RTRN 9FT ADLT (ELECTROSURGICAL) ×1 IMPLANT
GLOVE BIO SURGEON STRL SZ7.5 (GLOVE) ×6 IMPLANT
GLOVE BIO SURGEON STRL SZ8 (GLOVE) ×6 IMPLANT
GLOVE BIOGEL PI IND STRL 8 (GLOVE) ×1 IMPLANT
GLOVE BIOGEL PI INDICATOR 8 (GLOVE) ×2
GLOVE INDICATOR 8.0 STRL GRN (GLOVE) ×3 IMPLANT
GOWN STRL REUS W/ TWL LRG LVL3 (GOWN DISPOSABLE) ×2 IMPLANT
GOWN STRL REUS W/ TWL XL LVL3 (GOWN DISPOSABLE) ×1 IMPLANT
GOWN STRL REUS W/TWL LRG LVL3 (GOWN DISPOSABLE) ×4
GOWN STRL REUS W/TWL XL LVL3 (GOWN DISPOSABLE) ×2
HANDPIECE INTERPULSE COAX TIP (DISPOSABLE) ×2
HOLDER FOLEY CATH W/STRAP (MISCELLANEOUS) ×3 IMPLANT
HOOD PEEL AWAY FLYTE STAYCOOL (MISCELLANEOUS) ×12 IMPLANT
IMMBOLIZER KNEE 19 BLUE UNIV (SOFTGOODS) ×3 IMPLANT
KIT RM TURNOVER STRD PROC AR (KITS) ×3 IMPLANT
NDL SAFETY 18GX1.5 (NEEDLE) ×3 IMPLANT
NEEDLE 18GX1X1/2 (RX/OR ONLY) (NEEDLE) ×3 IMPLANT
NEEDLE SPNL 20GX3.5 QUINCKE YW (NEEDLE) ×3 IMPLANT
NS IRRIG 1000ML POUR BTL (IV SOLUTION) ×3 IMPLANT
PACK TOTAL KNEE (MISCELLANEOUS) ×3 IMPLANT
PAD WRAPON POLAR KNEE (MISCELLANEOUS) ×1 IMPLANT
SET HNDPC FAN SPRY TIP SCT (DISPOSABLE) ×1 IMPLANT
SOL .9 NS 3000ML IRR  AL (IV SOLUTION) ×2
SOL .9 NS 3000ML IRR UROMATIC (IV SOLUTION) ×1 IMPLANT
STAPLER SKIN PROX 35W (STAPLE) ×3 IMPLANT
SUCTION FRAZIER HANDLE 10FR (MISCELLANEOUS) ×2
SUCTION TUBE FRAZIER 10FR DISP (MISCELLANEOUS) ×1 IMPLANT
SUT VIC AB 0 CT1 36 (SUTURE) ×15 IMPLANT
SUT VIC AB 2-0 CT1 27 (SUTURE) ×10
SUT VIC AB 2-0 CT1 TAPERPNT 27 (SUTURE) ×5 IMPLANT
SYR 20CC LL (SYRINGE) ×3 IMPLANT
SYR 30ML LL (SYRINGE) ×3 IMPLANT
SYRINGE 10CC LL (SYRINGE) ×3 IMPLANT
SYSTEM VACUUM CEMENT MIXING ×3 IMPLANT
WRAPON POLAR PAD KNEE (MISCELLANEOUS) ×3

## 2016-05-19 NOTE — Anesthesia Procedure Notes (Signed)
Spinal  Patient location during procedure: OR Staffing Anesthesiologist: KEPHART, WILLIAM K Resident/CRNA: Duriel Deery Performed: resident/CRNA  Preanesthetic Checklist Completed: patient identified, site marked, surgical consent, pre-op evaluation, timeout performed, IV checked, risks and benefits discussed and monitors and equipment checked Spinal Block Patient position: sitting Prep: ChloraPrep and site prepped and draped Patient monitoring: heart rate, continuous pulse ox, blood pressure and cardiac monitor Approach: midline Location: L4-5 Injection technique: single-shot Needle Needle type: Whitacre and Introducer  Needle gauge: 24 G Needle length: 9 cm Additional Notes Negative paresthesia. Negative blood return. Positive free-flowing CSF. Expiration date of kit checked and confirmed. Patient tolerated procedure well, without complications.       

## 2016-05-19 NOTE — H&P (Signed)
Paper H&P to be scanned into permanent record. H&P reviewed. No changes. 

## 2016-05-19 NOTE — Anesthesia Preprocedure Evaluation (Signed)
Anesthesia Evaluation  Patient identified by MRN, date of birth, ID band Patient awake    History of Anesthesia Complications Negative for: history of anesthetic complications  Airway Mallampati: III     Mouth opening: Limited Mouth Opening  Dental   Pulmonary neg pulmonary ROS, former smoker,           Cardiovascular hypertension, Pt. on medications + Valvular Problems/Murmurs (murmur, no tx)      Neuro/Psych    GI/Hepatic negative GI ROS, Neg liver ROS,   Endo/Other  negative endocrine ROS  Renal/GU negative Renal ROS     Musculoskeletal   Abdominal   Peds  Hematology  (+) anemia ,   Anesthesia Other Findings   Reproductive/Obstetrics                             Anesthesia Physical Anesthesia Plan  ASA: II  Anesthesia Plan: Spinal   Post-op Pain Management:    Induction: Intravenous  Airway Management Planned:   Additional Equipment:   Intra-op Plan:   Post-operative Plan:   Informed Consent: I have reviewed the patients History and Physical, chart, labs and discussed the procedure including the risks, benefits and alternatives for the proposed anesthesia with the patient or authorized representative who has indicated his/her understanding and acceptance.     Plan Discussed with:   Anesthesia Plan Comments:         Anesthesia Quick Evaluation

## 2016-05-19 NOTE — Progress Notes (Signed)
CPM applied to pts right knee.

## 2016-05-19 NOTE — Progress Notes (Signed)
Patient gave RN DNR paper, RN placed in chart. DNR code status ordered by Dr. Roland Rack.   Deri Fuelling, RN

## 2016-05-19 NOTE — Evaluation (Signed)
Physical Therapy Evaluation Patient Details Name: Melinda Little MRN: [id number] DOB: 05-25-1930 Today's Date: 05/19/2016   History of Present Illness  Pt admitted for R TKR. Surgery performed 05/19/16. Pt with history of L TKR in 2005.   Clinical Impression  Pt is a pleasant 82 year old female who was admitted for R TKR. Pt performs bed mobility, transfers, and ambulation with cga and RW. Pt demonstrates deficits with strength/mobility/ROM/endurance. Pt able to perform 10 SLRs without assistance, no need for KI at this time. Would benefit from skilled PT to address above deficits and promote optimal return to PLOF. Recommend transition to [place] upon discharge from acute hospitalization.       Follow Up Recommendations Home health PT    Equipment Recommendations       Recommendations for Other Services       Precautions / Restrictions Precautions Precautions: Fall;Knee Precaution Booklet Issued: No Restrictions Weight Bearing Restrictions: Yes RLE Weight Bearing: Weight bearing as tolerated      Mobility  Bed Mobility Overal bed mobility: Needs Assistance Bed Mobility: Supine to Sit     Supine to sit: Min guard     General bed mobility comments: safe technique performed with cues for sequencing and technique. Once seated, able to sit with supervision  Transfers Overall transfer level: Needs assistance Equipment used: Rolling walker (2 wheeled) Transfers: Sit to/from Stand Sit to Stand: Min guard         General transfer comment: safe technique with RW and cues for sequencing. Keeps surgical leg propped out infront of her. Once standing, able to stand with supervision  Ambulation/Gait Ambulation/Gait assistance: Min guard Ambulation Distance (Feet): 5 Feet Assistive device: Rolling walker (2 wheeled) Gait Pattern/deviations: Step-to pattern     General Gait Details: ambulated to recliner with slow step to gait pattern and safe technique. Cues given for  correct sequencing.  Stairs            Wheelchair Mobility    Modified Rankin (Stroke Patients Only)       Balance Overall balance assessment: Needs assistance Sitting-balance support: Feet supported Sitting balance-Leahy Scale: Good     Standing balance support: Bilateral upper extremity supported Standing balance-Leahy Scale: Good                               Pertinent Vitals/Pain Pain Assessment: No/denies pain    Home Living Family/patient expects to be discharged to:: Private residence Living Arrangements: Spouse/significant other Available Help at Discharge: Family;Available 24 hours/day Type of Home: House Home Access: Stairs to enter Entrance Stairs-Rails: None Entrance Stairs-Number of Steps: 1 Home Layout: One level Home Equipment: Walker - 2 wheels;Cane - single point;Bedside commode      Prior Function Level of Independence: Independent with assistive device(s)         Comments: was using SPC for all mobility     Hand Dominance        Extremity/Trunk Assessment   Upper Extremity Assessment: Generalized weakness (B UE grossly 5/5)           Lower Extremity Assessment: Generalized weakness (R LE grossly 3/5; L LE grossly 5/5)         Communication   Communication: No difficulties  Cognition Arousal/Alertness: Awake/alert Behavior During Therapy: WFL for tasks assessed/performed Overall Cognitive Status: Within Functional Limits for tasks assessed  General Comments      Exercises Total Joint Exercises Goniometric ROM: R knee AAROM: 5-65 degrees Other Exercises Other Exercises: Pt performed R knee ther-ex including ankle pumps, quad sets, SLRs, and hip abd/add. All therex performed in supine position with min assist 10 reps   Assessment/Plan    PT Assessment Patient needs continued PT services  PT Problem List Decreased strength;Decreased range of motion;Decreased activity  tolerance;Decreased balance;Decreased mobility;Decreased knowledge of use of DME;Pain          PT Treatment Interventions Gait training;DME instruction;Therapeutic exercise    PT Goals (Current goals can be found in the Care Plan section)  Acute Rehab PT Goals Patient Stated Goal: to go home PT Goal Formulation: With patient Time For Goal Achievement: 06/02/16 Potential to Achieve Goals: Good    Frequency BID   Barriers to discharge        Co-evaluation               End of Session Equipment Utilized During Treatment: Gait belt Activity Tolerance: Patient tolerated treatment well Patient left: in chair;with chair alarm set;with SCD's reapplied;with family/visitor present Nurse Communication: Mobility status         Time: [id number] PT Time Calculation (min) (ACUTE ONLY): 34 min   Charges:   PT Evaluation $PT Eval Moderate Complexity: 1 Procedure PT Treatments $Therapeutic Exercise: 8-22 mins   PT G Codes:        Bev Drennen May 24, 2016, 5:12 PM  Greggory Stallion, PT, DPT [phone]

## 2016-05-19 NOTE — Transfer of Care (Signed)
Immediate Anesthesia Transfer of Care Note  Patient: Melinda Little  Procedure(s) Performed: Procedure(s): TOTAL KNEE ARTHROPLASTY (Right)  Patient Location: PACU  Anesthesia Type:Spinal  Level of Consciousness: awake, alert  and oriented  Airway & Oxygen Therapy: Patient Spontanous Breathing  Post-op Assessment: Report given to RN and Post -op Vital signs reviewed and stable  Post vital signs: Reviewed  Last Vitals:  Vitals:   05/19/16 0559  BP: (!) 160/87  Pulse: 86  Resp: 16  Temp: 36.2 C    Last Pain:  Vitals:   05/19/16 0559  TempSrc: Tympanic         Complications: No apparent anesthesia complications

## 2016-05-19 NOTE — NC FL2 (Signed)
[place] LEVEL OF CARE SCREENING TOOL     IDENTIFICATION  Patient Name: Melinda Little Birthdate: 1929-12-20 Sex: female Admission Date (Current Location): 05/19/2016  [place] and [place] Number:  Engineering geologist and Address:  [facility], [address] [place], [place] [address]      Provider Number: [id number]  Attending Physician Name and Address:  Corky Mull, MD  Relative Name and Phone Number:       Current Level of Care: Hospital Recommended Level of Care: [place] Prior Approval Number:    Date Approved/Denied:   PASRR Number:  ([id number])  Discharge Plan: SNF    Current Diagnoses: Patient Active Problem List   Diagnosis Date Noted  . Status post total right knee replacement using cement 05/19/2016  . Intraductal carcinoma of breast 10/25/2015    Orientation RESPIRATION BLADDER Height & Weight     Self, Time, Situation, Place  Normal External catheter Weight: 130 lb 1.1 oz (59 kg) Height:  5\' 4"  (162.6 cm)  BEHAVIORAL SYMPTOMS/MOOD NEUROLOGICAL BOWEL NUTRITION STATUS   (None)  (None.) Continent Diet (Diet: Clear Liquid)  AMBULATORY STATUS COMMUNICATION OF NEEDS Skin   Extensive Assist Verbally Surgical wounds (Incision: Right Knee)                       Personal Care Assistance Level of Assistance  Bathing, Feeding, Dressing Bathing Assistance: Limited assistance Feeding assistance: Independent Dressing Assistance: Limited assistance     Functional Limitations Info  Sight, Hearing, Speech Sight Info: Adequate Hearing Info: Impaired Speech Info: Adequate    SPECIAL CARE FACTORS FREQUENCY  PT (By licensed PT), OT (By licensed OT)     PT Frequency:  (5) OT Frequency:  (5)            Contractures      Additional Factors Info  Code Status, Allergies Code Status Info:  (DNR) Allergies Info:  (Ciprofloxacin)           Current Medications (05/19/2016):  This is the  current hospital active medication list Current Facility-Administered Medications  Medication Dose Route Frequency Provider Last Rate Last Dose  . acetaminophen (TYLENOL) tablet 650 mg  650 mg Oral Q6H PRN Corky Mull, MD       Or  . acetaminophen (TYLENOL) suppository 650 mg  650 mg Rectal Q6H PRN Corky Mull, MD      . acetaminophen (TYLENOL) tablet 1,000 mg  1,000 mg Oral Q6H Corky Mull, MD      . aspirin EC tablet 81 mg  81 mg Oral Daily Corky Mull, MD      . bisacodyl (DULCOLAX) suppository 10 mg  10 mg Rectal Daily PRN Corky Mull, MD      . ceFAZolin (ANCEF) IVPB 2g/100 mL premix  2 g Intravenous Q6H Corky Mull, MD   2 g at 05/19/16 1215  . cholecalciferol (VITAMIN D) tablet 400 Units  400 Units Oral Daily Marshall Cork Poggi, MD      . dextrose 5 % and 0.9 % NaCl with KCl 20 mEq/L infusion   Intravenous Continuous Corky Mull, MD 75 mL/hr at 05/19/16 1215    . diphenhydrAMINE (BENADRYL) 12.5 MG/5ML elixir 12.5-25 mg  12.5-25 mg Oral Q4H PRN Corky Mull, MD      . docusate sodium (COLACE) capsule 100 mg  100 mg Oral BID Corky Mull, MD      . enoxaparin (  LOVENOX) injection 30 mg  30 mg Subcutaneous Q12H Corky Mull, MD      . ferrous sulfate tablet 325 mg  325 mg Oral BID WC Corky Mull, MD      . HYDROmorphone (DILAUDID) injection 0.5-1 mg  0.5-1 mg Intravenous Q2H PRN Corky Mull, MD      . lisinopril (PRINIVIL,ZESTRIL) tablet 10 mg  10 mg Oral Daily Corky Mull, MD      . magnesium hydroxide (MILK OF MAGNESIA) suspension 30 mL  30 mL Oral Daily PRN Corky Mull, MD      . metoCLOPramide (REGLAN) tablet 5-10 mg  5-10 mg Oral Q8H PRN Corky Mull, MD       Or  . metoCLOPramide (REGLAN) injection 5-10 mg  5-10 mg Intravenous Q8H PRN Corky Mull, MD      . ondansetron [facility]) tablet 4 mg  4 mg Oral Q6H PRN Corky Mull, MD       Or  . ondansetron [facility]) injection 4 mg  4 mg Intravenous Q6H PRN Corky Mull, MD      . oxyCODONE (Oxy IR/ROXICODONE) immediate release tablet  5-10 mg  5-10 mg Oral Q3H PRN Corky Mull, MD   5 mg at 05/19/16 1313  . sodium phosphate (FLEET) 7-19 GM/118ML enema 1 enema  1 enema Rectal Once PRN Corky Mull, MD      . traMADol Veatrice Bourbon) tablet 50-100 mg  50-100 mg Oral Q6H PRN Corky Mull, MD         Discharge Medications: Please see discharge summary for a list of discharge medications.  Relevant Imaging Results:  Relevant Lab Results:   Additional Information  (SSN: [id number])  Danie Chandler, Student-Social Work

## 2016-05-19 NOTE — Op Note (Signed)
05/19/2016  9:59 AM  Patient:   Melinda Little  Pre-Op Diagnosis:   Degenerative joint disease, right knee.  Post-Op Diagnosis:   Same  Procedure:   Right TKA using all-cemented Bi[employer] Vanguard PS system with a 62.5 mm open box PS femur, a 75 mm tibial tray with a 12 mm E-poly posted insert, and a 34 x 8.5 mm all-poly 3-pegged domed patella.  Surgeon:   Pascal Lux, MD  Assistant:   Cameron Proud, PA-C   Anesthesia:   Spinal  Findings:   As above  Complications:   None  EBL:   20 cc  Fluids:   1200 cc crystalloid  UOP:   200 cc  TT:   103 minutes at 300 mmHg  Drains:   None  Closure:   Staples  Implants:   As above  Brief Clinical Note:   The patient is an 82 year old female  with a long history of progressively worsening right knee pain. The patient's symptoms have progressed despite medications, activity modification, injections, etc. The patient's history and examination were consistent with advanced degenerative joint disease of the right knee confirmed by plain radiographs. The patient presents at this time for a right total knee arthroplasty.  Procedure:   The patient was brought into the operating room. After adequate spinal anesthesia was obtained, the patient was lain in the supine position. A Foley catheter was placed by the nurse before the right lower extremity was prepped with ChloraPrep solution and draped sterilely. Preoperative antibiotics were administered. After verifying the proper laterality with a surgical timeout, the limb was exsanguinated with an Esmarch and the tourniquet inflated to 300 mmHg. A standard anterior approach to the knee was made through an approximately 7 inch incision. The incision was carried down through the subcutaneous tissues to expose superficial retinaculum. This was split the length of the incision and the medial flap elevated sufficiently to expose the medial retinaculum. The medial retinaculum was incised, leaving a 3-4 mm  cuff of tissue on the patella. This was extended distally along the medial border of the patellar tendon and proximally through the medial third of the quadriceps tendon. A subtotal fat pad excision was performed before the soft tissues were elevated off the anteromedial and anterolateral aspects of the proximal tibia to the level of the collateral ligaments. The anterior portions of the medial and lateral menisci were removed, as was the anterior cruciate ligament. With the knee flexed to 90, the external tibial guide was positioned and the appropriate proximal tibial cut made. This piece was taken to the back table where it was measured and found to be optimally replicated by a 75 mm component.  Attention was directed to the distal femur. The intramedullary canal was accessed through a 3/8" drill hole. The intramedullary guide was inserted and position in order to obtain a neutral flexion gap. The intercondylar block was positioned with care taken to avoid notching the anterior cortex of the femur. The appropriate cut was made. Next, the distal cutting block was placed at 6 of valgus alignment. Using the 9 mm slot, the distal cut was made. The distal femur was measured and found to be optimally replicated by the 123XX123 mm component. The 62.5 mm 4-in-1 cutting block was positioned and first the posterior, then the posterior chamfer, the anterior chamfer, femoral and intercondylar cuts were made. Next, the 62.5 mm box cutting guide was positioned and the appropriate box cut made using the box osteotome and reciprocating saw. At this  point, the posterior portions medial and lateral menisci were removed. A trial reduction was performed using the appropriate femoral and tibial components with first the 10 mm PS and then the 12 mm PS insert. The 12 mm PS insert demonstrated excellent stability to varus and valgus stressing both in flexion and extension while permitting full extension. Patella tracking was assessed and  found to be excellent. Therefore, the tibial guide position was marked on the proximal tibia. The patella thickness was measured and found to be 20 mm. Therefore, the appropriate cut was made. The surfaces were measured and found to be optimally replicated by the 34 mm component. The three peg holes were drilled in place before the trial button was inserted. Patella tracking was assessed and found to be excellent, passing the "no thumb test". The lug holes were drilled into the distal femur before the trial component was removed, leaving only the tibial tray. The keel was then created using the appropriate tower, reamer, and punch.  The bony surfaces were prepared for cementing by irrigating thoroughly with bacitracin saline solution. A bone plug was fashioned from some of the bone that had been removed previously and used to plug the distal femoral canal. In addition, 20 cc of Exparel diluted out to 60 cc with normal saline and 30 cc of 0.5% Sensorcaine were injected into the postero-medial and postero-lateral aspects of the knee, the medial and lateral gutter regions, and the peri-incisional tissues to help with postoperative analgesia. Meanwhile, the cement was being mixed on the back table. When it was ready, the tibial tray was cemented in first. The excess cement was removed using [employer] fast streamer. Next, the femoral component was impacted into place. Again, the excess cement was removed using [employer] fast streamer. The 12 mm PS trial insert was positioned and the knee brought into extension while the cement hardened. Finally, the patella was cemented into place and secured using the patellar clamp. Again, the excess cement was removed using [employer] fast streamer. Once the cement had hardened, the knee was placed through a range of motion with the findings as described above. Therefore, the trial insert was removed and, after verifying that no cement had been retained posteriorly, the permanent 12 mm PS E1 polyethylene  insert was positioned and secured using the appropriate key locking mechanism. Again the knee was placed through a range of motion with the findings as described above.  The wound was copiously irrigated with bacitracin saline solution using the jet lavage system before the quadriceps tendon and retinacular layer were reapproximated using #0 Vicryl interrupted sutures. The superficial retinacular layer was closed using 2-0 Vicryl interrupted sutures in several layers for the skin was closed using staples. A sterile honeycomb dressing was applied to the skin before the leg was wrapped with an Ace wrap to accommodate the polar pack. The patient was then awakened and returned to the recovery room in satisfactory condition after tolerating the procedure well.

## 2016-05-20 ENCOUNTER — Encounter: Payer: Self-pay | Admitting: Surgery

## 2016-05-20 LAB — CBC WITH DIFFERENTIAL/PLATELET
Basophils Absolute: 0 10*3/uL (ref 0–0.1)
Basophils Relative: 0 %
Eosinophils Absolute: 0 10*3/uL (ref 0–0.7)
Eosinophils Relative: 0 %
HEMATOCRIT: 30.8 % — AB (ref 35.0–47.0)
HEMOGLOBIN: 10.6 g/dL — AB (ref 12.0–16.0)
LYMPHS ABS: 0.2 10*3/uL — AB (ref 1.0–3.6)
LYMPHS PCT: 3 %
MCH: 31.8 pg (ref 26.0–34.0)
MCHC: 34.5 g/dL (ref 32.0–36.0)
MCV: 92.1 fL (ref 80.0–100.0)
MONO ABS: 1 10*3/uL — AB (ref 0.2–0.9)
MONOS PCT: 10 %
NEUTROS ABS: 8.8 10*3/uL — AB (ref 1.4–6.5)
NEUTROS PCT: 87 %
Platelets: 152 10*3/uL (ref 150–440)
RBC: 3.34 MIL/uL — ABNORMAL LOW (ref 3.80–5.20)
RDW: 13.2 % (ref 11.5–14.5)
WBC: 10.1 10*3/uL (ref 3.6–11.0)

## 2016-05-20 LAB — BASIC METABOLIC PANEL
ANION GAP: 7 (ref 5–15)
BUN: 13 mg/dL (ref 6–20)
CALCIUM: 8.3 mg/dL — AB (ref 8.9–10.3)
CHLORIDE: 106 mmol/L (ref 101–111)
CO2: 20 mmol/L — AB (ref 22–32)
CREATININE: 0.47 mg/dL (ref 0.44–1.00)
GFR calc non Af Amer: >60 mL/min (ref >60)
GLUCOSE: 187 mg/dL — AB (ref 65–99)
Potassium: 3.8 mmol/L (ref 3.5–5.1)
Sodium: 133 mmol/L — ABNORMAL LOW (ref 135–145)

## 2016-05-20 NOTE — Progress Notes (Signed)
Notify PA in am if continues with low grade, will make decision on orders at that time.

## 2016-05-20 NOTE — Progress Notes (Signed)
Foley d/c'd at 0500. 

## 2016-05-20 NOTE — Progress Notes (Signed)
Clinical Social Worker (CSW) received SNF consult. PT is recommending home health. RN case manager is aware of above. Please reconsult if future social work needs arise. CSW signing off.   Adabella Stanis, LCSW [phone] 

## 2016-05-20 NOTE — Anesthesia Postprocedure Evaluation (Signed)
Anesthesia Post Note  Patient: Melinda Little  Procedure(s) Performed: Procedure(s) (LRB): TOTAL KNEE ARTHROPLASTY (Right)  Patient location during evaluation: Nursing Unit Anesthesia Type: Spinal Level of consciousness: awake, awake and alert and oriented Pain management: pain level controlled Vital Signs Assessment: post-procedure vital signs reviewed and stable Respiratory status: spontaneous breathing, nonlabored ventilation and respiratory function stable Cardiovascular status: stable Postop Assessment: no backache, no signs of nausea or vomiting and patient able to bend at knees Anesthetic complications: no    Last Vitals:  Vitals:   05/20/16 0357 05/20/16 0732  BP: (!) 126/43 (!) 137/48  Pulse: 76 81  Resp: 16 16  Temp: 37.1 C 37 C    Last Pain:  Vitals:   05/20/16 0732  TempSrc: Oral  PainSc:                  [name]

## 2016-05-20 NOTE — Progress Notes (Signed)
  Subjective: 1 Day Post-Op Procedure(s) (LRB): TOTAL KNEE ARTHROPLASTY (Right) Patient reports pain as mild.   Patient is well, and has had no acute complaints or problems Plan is to go Home after hospital stay. Negative for chest pain and shortness of breath Fever: no Gastrointestinal:Postive for nausea yesterday following surgery.  Objective: Vital signs in last 24 hours: Temp:  [97.4 F (36.3 C)-99 F (37.2 C)] 98.7 F (37.1 C) (11/08 0357) Pulse Rate:  [66-92] 76 (11/08 0357) Resp:  [14-22] 16 (11/08 0357) BP: (105-146)/(43-78) 126/43 (11/08 0357) SpO2:  [97 %-100 %] 98 % (11/08 0357) FiO2 (%):  [21 %] 21 % (11/07 1128) Weight:  [59 kg (130 lb 1.1 oz)] 59 kg (130 lb 1.1 oz) (11/07 1156)  Intake/Output from previous day:  Intake/Output Summary (Last 24 hours) at 05/20/16 0727 Last data filed at 05/20/16 0507  Gross per 24 hour  Intake          3421.67 ml  Output             1620 ml  Net          1801.67 ml    Intake/Output this shift: No intake/output data recorded.  Labs:  Recent Labs  05/20/16 0544  HGB 10.6*    Recent Labs  05/20/16 0544  WBC 10.1  RBC 3.34*  HCT 30.8*  PLT 152    Recent Labs  05/20/16 0544  NA 133*  K 3.8  CL 106  CO2 20*  BUN 13  CREATININE 0.47  GLUCOSE 187*  CALCIUM 8.3*   No results for input(s): LABPT, INR in the last 72 hours.   EXAM General - Patient is Alert, Appropriate and Oriented Extremity - ABD soft Sensation intact distally Intact pulses distally Dorsiflexion/Plantar flexion intact Incision: dressing C/D/I No cellulitis present Dressing/Incision - clean, dry, no drainage Motor Function - intact, moving foot and toes well on exam.   Abdomen soft with normal BS.  Past Medical History:  Diagnosis Date  . Anemia   . Breast cancer, right [facility]) 2012   right breast with lumpectomy and mammosite rad tx  . Heart murmur   . Hypertension   . Pre-diabetes     Assessment/Plan: 1 Day Post-Op  Procedure(s) (LRB): TOTAL KNEE ARTHROPLASTY (Right) Active Problems:   Status post total right knee replacement using cement  Estimated body mass index is 22.33 kg/m as calculated from the following:   Height as of this encounter: 5\' 4"  (1.626 m).   Weight as of this encounter: 59 kg (130 lb 1.1 oz). Advance diet Up with therapy D/C IV fluids when tolerating PO intake.  Labs reviewed, Na 133, encouraged PO intake. Hg 10.6. CBC and BMP ordered for tomorrow morning. Begin working on BM, Foley removed this AM. PT currently recommending HHPT, Care management to assist with discharge. Plan will be for possible discharge tomorrow pending PT and condition.  DVT Prophylaxis - Lovenox, Foot Pumps and TED hose Weight-Bearing as tolerated to right leg  J. Cameron Proud, PA-C [facility] Orthopaedic [facility] 05/20/2016, 7:27 AM

## 2016-05-20 NOTE — Care Management Note (Signed)
Case Management Note  Patient Details  Name: Tinika Bucknam MRN: [id number] Date of Birth: 06/22/1930  Subjective/Objective:  POD 1 TKA. Met with patient at bedside. She lives at home with her spouse.he will be her caregiver at discharge. Patient open to home health PT and prefers [facility]. Pharmacy: [facility]  [phone]. Called Lovenox 40 mg, #14, no refills. She has a walker, cane, BSC and shower bench.                  Action/Plan: Referral called to [facility].   Expected Discharge Date:                  Expected Discharge Plan:  [place]  In-House Referral:     Discharge planning Services  CM Consult  Post Acute Care Choice:  Home Health Choice offered to:  Patient  DME Arranged:    DME Agency:     HH Arranged:  PT [place]:  [facility] (now [facility])  Status of Service:  In process, will continue to follow  If discussed at Long Length of Stay Meetings, dates discussed:    Additional Comments:  Jolly Mango, RN 05/20/2016, 9:32 AM

## 2016-05-20 NOTE — Progress Notes (Signed)
Pt. Refusing CPM machine stating that Dr. Roland Rack told her today she didn't have to wear machine.

## 2016-05-20 NOTE — Progress Notes (Signed)
Physical Therapy Treatment Patient Details Name: Melinda Little MRN: [id number] DOB: Dec 02, 1929 Today's Date: 05/20/2016    History of Present Illness Pt admitted for R TKR. Surgery performed 05/19/16. Pt with history of L TKR in 2005.     PT Comments    Pt is making good progress towards goals with improved ambulation this date. Pt still requires cues for sequencing and is unable to achieve consistent reciprocal gait pattern. Good endurance noted with there-ex this date. Pt appears motivated to perform therapy at this time. Pt performs bathroom tasks safely, RN notified. Plan to perform stair training next date.  Follow Up Recommendations  Home health PT     Equipment Recommendations       Recommendations for Other Services       Precautions / Restrictions Precautions Precautions: Fall;Knee Precaution Booklet Issued: Yes (comment) Restrictions Weight Bearing Restrictions: Yes RLE Weight Bearing: Weight bearing as tolerated    Mobility  Bed Mobility Overal bed mobility: Needs Assistance Bed Mobility: Sit to Supine     Supine to sit: Min guard Sit to supine: Min guard   General bed mobility comments: safe technique performed with cues for sequencing and technique. Pt needs slight assist to raise R LE onto bed.  Transfers Overall transfer level: Needs assistance Equipment used: Rolling walker (2 wheeled) Transfers: Sit to/from Stand Sit to Stand: Min guard         General transfer comment: safe technique for all transfers with RW. Upright posture noted with no LOB.  Ambulation/Gait Ambulation/Gait assistance: Min guard Ambulation Distance (Feet): 190 Feet Assistive device: Rolling walker (2 wheeled) Gait Pattern/deviations: Step-to pattern     General Gait Details: ambulated using step to gait pattern with slow gait speed. Pt needs heavy cues to progress to reciprocal gait pattern, however inconsistent. No fatigue noted. Cues to keep RW moving and eyes  forward to improve posture.    Stairs            Wheelchair Mobility    Modified Rankin (Stroke Patients Only)       Balance                                    Cognition Arousal/Alertness: Awake/alert Behavior During Therapy: WFL for tasks assessed/performed Overall Cognitive Status: Within Functional Limits for tasks assessed                      Exercises Total Joint Exercises Goniometric ROM: R knee AAROM: 3-80 degrees Other Exercises Other Exercises: Pt performed R knee ther-ex including ankle pumps, quad sets, SLRs, SAQ, and hip abd/add. All therex performed in supine position with cga 12 reps. Other Exercises: Pt ambulated to [facility] and able to perform self hygiene tasks with cga. Safe technique performed.    General Comments        Pertinent Vitals/Pain Pain Assessment: 0-10 Pain Score: 5  Pain Location: R knee Pain Descriptors / Indicators: Operative site guarding Pain Intervention(s): Limited activity within patient's tolerance;Ice applied    Home Living                      Prior Function            PT Goals (current goals can now be found in the care plan section) Acute Rehab PT Goals Patient Stated Goal: to go home PT Goal Formulation: With patient Time For Goal Achievement:  06/02/16 Potential to Achieve Goals: Good Progress towards PT goals: Progressing toward goals    Frequency    BID      PT Plan Current plan remains appropriate    Co-evaluation             End of Session Equipment Utilized During Treatment: Gait belt Activity Tolerance: Patient tolerated treatment well Patient left: in bed;with bed alarm set;with SCD's reapplied     Time: [id number] PT Time Calculation (min) (ACUTE ONLY): 38 min  Charges:  $Gait Training: 23-37 mins $Therapeutic Exercise: 8-22 mins                    G Codes:      Melinda Little 06-09-2016, 3:28 PM  Melinda Little, PT, DPT [phone]

## 2016-05-20 NOTE — Progress Notes (Signed)
Physical Therapy Treatment Patient Details Name: Melinda Little MRN: [id number] DOB: Mar 12, 1930 Today's Date: 05/20/2016    History of Present Illness Pt admitted for R TKR. Surgery performed 05/19/16. Pt with history of L TKR in 2005.     PT Comments    Pt is making good progress towards goals with increased ambulation noted this session. Pt appears motivated to perform mobility. Good endurance with all there-ex and progress with ROM. Needs cues for sequencing of RW and to perform reciprocal gait pattern. Will continue to progress as able. Educated on written HEP at this time.  Follow Up Recommendations  Home health PT     Equipment Recommendations       Recommendations for Other Services       Precautions / Restrictions Precautions Precautions: Fall;Knee Precaution Booklet Issued: Yes (comment) Restrictions Weight Bearing Restrictions: Yes RLE Weight Bearing: Weight bearing as tolerated    Mobility  Bed Mobility Overal bed mobility: Needs Assistance Bed Mobility: Supine to Sit     Supine to sit: Min guard     General bed mobility comments: safe technique performed with cues for sequencing and technique. Once seated, able to sit with supervision  Transfers Overall transfer level: Needs assistance Equipment used: Rolling walker (2 wheeled) Transfers: Sit to/from Stand Sit to Stand: Min guard         General transfer comment: safe technique with RW and cues for sequencing. Once standing, pt able to stand with upright posture.  Ambulation/Gait Ambulation/Gait assistance: Min guard Ambulation Distance (Feet): 50 Feet Assistive device: Rolling walker (2 wheeled) Gait Pattern/deviations: Step-to pattern     General Gait Details: ambulated with slow step to gait pattern noted. Pt needs cues for sequencing and to progress to reciprocal gait pattern. Inconsistent reciprocal pattern. Safe technique performed. Pt fatigues, requesting to return back to  room   Stairs            Wheelchair Mobility    Modified Rankin (Stroke Patients Only)       Balance                                    Cognition Arousal/Alertness: Awake/alert Behavior During Therapy: WFL for tasks assessed/performed Overall Cognitive Status: Within Functional Limits for tasks assessed                      Exercises Total Joint Exercises Goniometric ROM: R knee AAROM: 3-80 degrees Other Exercises Other Exercises: Pt performed R knee ther-ex including ankle pumps, quad sets, SLRs, SAQ, heel slides, and hip abd/add. All therex performed in supine position with cga 12 reps    General Comments        Pertinent Vitals/Pain Pain Assessment: 0-10 Pain Score: 5  Pain Location: R knee Pain Descriptors / Indicators: Operative site guarding Pain Intervention(s): Limited activity within patient's tolerance;RN gave pain meds during session;Ice applied    Home Living                      Prior Function            PT Goals (current goals can now be found in the care plan section) Acute Rehab PT Goals Patient Stated Goal: to go home PT Goal Formulation: With patient Time For Goal Achievement: 06/02/16 Potential to Achieve Goals: Good Progress towards PT goals: Progressing toward goals    Frequency  BID      PT Plan Current plan remains appropriate    Co-evaluation             End of Session Equipment Utilized During Treatment: Gait belt Activity Tolerance: Patient tolerated treatment well Patient left: in chair;with chair alarm set;with SCD's reapplied;with family/visitor present     Time: [id number] PT Time Calculation (min) (ACUTE ONLY): 33 min  Charges:  $Gait Training: 8-22 mins $Therapeutic Exercise: 8-22 mins                    G Codes:      Deshia Vanderhoof 02-Jun-2016, 11:40 AM Greggory Stallion, PT, DPT [phone]

## 2016-05-21 ENCOUNTER — Inpatient Hospital Stay: Payer: Medicare Other

## 2016-05-21 LAB — CBC
HEMATOCRIT: 27.9 % — AB (ref 35.0–47.0)
Hemoglobin: 9.7 g/dL — ABNORMAL LOW (ref 12.0–16.0)
MCH: 31.5 pg (ref 26.0–34.0)
MCHC: 34.7 g/dL (ref 32.0–36.0)
MCV: 91 fL (ref 80.0–100.0)
Platelets: 144 10*3/uL — ABNORMAL LOW (ref 150–440)
RBC: 3.07 MIL/uL — AB (ref 3.80–5.20)
RDW: 13.2 % (ref 11.5–14.5)
WBC: 12.4 10*3/uL — AB (ref 3.6–11.0)

## 2016-05-21 LAB — BASIC METABOLIC PANEL
Anion gap: 7 (ref 5–15)
BUN: 11 mg/dL (ref 6–20)
CHLORIDE: 106 mmol/L (ref 101–111)
CO2: 19 mmol/L — AB (ref 22–32)
Calcium: 8.1 mg/dL — ABNORMAL LOW (ref 8.9–10.3)
Creatinine, Ser: 0.4 mg/dL — ABNORMAL LOW (ref 0.44–1.00)
GFR calc Af Amer: >60 mL/min (ref >60)
GFR calc non Af Amer: >60 mL/min (ref >60)
Glucose, Bld: 124 mg/dL — ABNORMAL HIGH (ref 65–99)
POTASSIUM: 3.1 mmol/L — AB (ref 3.5–5.1)
SODIUM: 132 mmol/L — AB (ref 135–145)

## 2016-05-21 LAB — POTASSIUM: Potassium: 4.2 mmol/L (ref 3.5–5.1)

## 2016-05-21 MED ORDER — POTASSIUM CHLORIDE 20 MEQ PO PACK
20.0000 meq | PACK | Freq: Three times a day (TID) | ORAL | Status: AC
  Administered 2016-05-21 (×3): 20 meq via ORAL
  Filled 2016-05-21 (×3): qty 1

## 2016-05-21 MED ORDER — OXYCODONE HCL 5 MG PO TABS: 5.0000 mg | ORAL_TABLET | ORAL | 0 refills | Status: DC | PRN

## 2016-05-21 NOTE — Care Management (Signed)
Cost of Lovenox is $ 93.33. Patient updated.

## 2016-05-21 NOTE — Progress Notes (Signed)
Physical Therapy Treatment Patient Details Name: Melinda Little MRN: [id number] DOB: [birth year] Today's Date: 05/21/2016    History of Present Illness Pt admitted for R TKR. Surgery performed 05/19/16. Pt with history of L TKR in 2005.     PT Comments    Pt is making good progress towards goals with ability to safely ambulate and navigate stairs with safe technique. Improved progress performed with reciprocal gait pattern. Good progress with there-ex. Pt is safe to discharge at this time. All questions answered.  Follow Up Recommendations  Home health PT     Equipment Recommendations       Recommendations for Other Services       Precautions / Restrictions Precautions Precautions: Fall;Knee Precaution Booklet Issued: Yes (comment) Restrictions Weight Bearing Restrictions: Yes RLE Weight Bearing: Weight bearing as tolerated    Mobility  Bed Mobility Overal bed mobility: Needs Assistance Bed Mobility: Sit to Supine     Supine to sit: Min guard Sit to supine: Min guard   General bed mobility comments: safe technique with cues given and slight assist given for lifting R foot towards bed.  Transfers Overall transfer level: Needs assistance Equipment used: Rolling walker (2 wheeled) Transfers: Sit to/from Stand Sit to Stand: Min guard         General transfer comment: safe technique for all transfers with RW. Upright posture noted with no LOB.  Ambulation/Gait Ambulation/Gait assistance: Supervision Ambulation Distance (Feet): 150 Feet Assistive device: Rolling walker (2 wheeled) Gait Pattern/deviations: Step-through pattern     General Gait Details: Reciprocal gait pattern performed with cues for consistent gait pattern. Fluid gait pattern performed. Safe technique performed. Cues performed for upright posture and upright gaze.   Stairs Stairs: Yes Stairs assistance: Min guard Stair Management: No rails Number of Stairs: 1 General stair comments: Pt able  to ambulate up/down 1 step using RW and no rails. Pt performed safe technique  Wheelchair Mobility    Modified Rankin (Stroke Patients Only)       Balance                                    Cognition Arousal/Alertness: Awake/alert Behavior During Therapy: WFL for tasks assessed/performed Overall Cognitive Status: Within Functional Limits for tasks assessed                      Exercises Total Joint Exercises Goniometric ROM: R knee AAROM: 1-81 degrees Other Exercises Other Exercises: Supine ther-ex performed on R LE including ankle pumps, quad sets, LAQ, SLRs, SAQ, and hip abd/add. All ther-ex performed x 15 reps with cga.    General Comments        Pertinent Vitals/Pain Pain Assessment: 0-10 Pain Score: 4  Pain Location: R knee Pain Descriptors / Indicators: Operative site guarding Pain Intervention(s): Limited activity within patient's tolerance    Home Living                      Prior Function            PT Goals (current goals can now be found in the care plan section) Acute Rehab PT Goals Patient Stated Goal: to go home PT Goal Formulation: With patient Time For Goal Achievement: 06/02/16 Potential to Achieve Goals: Good Progress towards PT goals: Progressing toward goals    Frequency    BID      PT Plan Current plan  remains appropriate    Co-evaluation             End of Session Equipment Utilized During Treatment: Gait belt Activity Tolerance: Patient tolerated treatment well Patient left: in bed;with bed alarm set     Time: [id number] PT Time Calculation (min) (ACUTE ONLY): 38 min  Charges:  $Gait Training: 23-37 mins $Therapeutic Exercise: 8-22 mins                    G Codes:      Melinda Little 06/01/16, 3:23 PM Greggory Stallion, PT, DPT [phone]

## 2016-05-21 NOTE — Discharge Summary (Signed)
Physician Discharge Summary  Patient ID: Melinda Little MRN: [id number] DOB/AGE: 02-11-1930 82 y.o.  Admit date: 05/19/2016 Discharge date: 05/22/2016  Admission Diagnoses:  PRIMARY OSTEOARTHRITIS Degenerative joint disease of the right knee  Discharge Diagnoses: Patient Active Problem List   Diagnosis Date Noted  . Status post total right knee replacement using cement 05/19/2016  . Intraductal carcinoma of breast 10/25/2015  Degenerative joint disease of the right knee  Past Medical History:  Diagnosis Date  . Anemia   . Breast cancer, right [facility]) 2012   right breast with lumpectomy and mammosite rad tx  . Heart murmur   . Hypertension   . Pre-diabetes      Transfusion: None   Consultants (if any):   Discharged Condition: Improved  Hospital Course: Melinda Little is an 82 y.o. female who was admitted 05/19/2016 with a diagnosis of degenerative joint disease of the right knee and went to the operating room on 05/19/2016 and underwent the above named procedures.    Surgeries: Procedure(s): TOTAL KNEE ARTHROPLASTY on 05/19/2016 Patient tolerated the surgery well. Taken to PACU where she was stabilized and then transferred to the orthopedic floor.  Started on Lovenox 30mg  q 12 hrs. Foot pumps applied bilaterally at 80 mm. Heels elevated on bed with rolled towels. No evidence of DVT. Negative Homan. Physical therapy started on day #1 for gait training and transfer. OT started day #1 for ADL and assisted devices.  Patient's IV and foley were d/c on POD1.  Implants: All-cemented Bi[employer] Vanguard PS system with a 62.5 mm open box PS femur, a 75 mm tibial tray with a 12 mm E-poly posted insert, and a 34 x 8.5 mm all-poly 3-pegged domed patella.  She was given perioperative antibiotics:  Anti-infectives    Start     Dose/Rate Route Frequency Ordered Stop   05/19/16 1300  ceFAZolin (ANCEF) IVPB 2g/100 mL premix     2 g 200 mL/hr over 30 Minutes Intravenous Every 6 hours  05/19/16 1127 05/20/16 0111   05/19/16 0546  ceFAZolin (ANCEF) 2-4 GM/100ML-% IVPB    Comments:  KENNEDY, ASHLEY: cabinet override      05/19/16 0546 05/19/16 0743   05/19/16 0300  ceFAZolin (ANCEF) IVPB 2g/100 mL premix     2 g 200 mL/hr over 30 Minutes Intravenous  Once 05/19/16 0259 05/19/16 0758    .  She was given sequential compression devices, early ambulation, and Lovenox for DVT prophylaxis.  She benefited maximally from the hospital stay and there were no complications.    Recent vital signs:  Vitals:   05/21/16 1923 05/22/16 0351  BP: (!) 115/51 (!) 128/53  Pulse: 95 95  Resp: 19 19  Temp: 99.5 F (37.5 C) 99.4 F (37.4 C)    Recent laboratory studies:  Lab Results  Component Value Date   HGB 9.1 (L) 05/22/2016   HGB 9.7 (L) 05/21/2016   HGB 10.6 (L) 05/20/2016   Lab Results  Component Value Date   WBC 10.1 05/22/2016   PLT 138 (L) 05/22/2016   Lab Results  Component Value Date   INR 0.96 05/05/2016   Lab Results  Component Value Date   NA 130 (L) 05/22/2016   K 4.0 05/22/2016   CL 104 05/22/2016   CO2 20 (L) 05/22/2016   BUN 10 05/22/2016   CREATININE 0.43 (L) 05/22/2016   GLUCOSE 118 (H) 05/22/2016    Discharge Medications:     Medication List    TAKE these medications   aspirin EC  81 MG tablet Take by mouth daily.   cholecalciferol 400 units Tabs tablet Commonly known as:  VITAMIN D Take 400 Units by mouth daily.   ferrous sulfate 325 (65 FE) MG tablet Take 325 mg by mouth 2 (two) times daily with a meal.   ibuprofen 200 MG tablet Commonly known as:  ADVIL,MOTRIN Take 400 mg by mouth daily.   lisinopril 10 MG tablet Commonly known as:  PRINIVIL,ZESTRIL TAKE 1 TABLET BY MOUTH EVERY DAY   oxyCODONE 5 MG immediate release tablet Commonly known as:  Oxy IR/ROXICODONE Take 1-2 tablets (5-10 mg total) by mouth every 4 (four) hours as needed for breakthrough pain.   traMADol 50 MG tablet Commonly known as:  ULTRAM Take 50 mg by  mouth every 6 (six) hours as needed (for pain.).            Durable Medical Equipment        Start     Ordered   05/19/16 1128  DME Walker rolling  Once     05/19/16 1127   05/19/16 1128  DME 3 n 1  Once     05/19/16 1127   05/19/16 1128  DME Bedside commode  Once     05/19/16 1127      Diagnostic Studies: Dg Chest 2 View  Result Date: 05/21/2016 CLINICAL DATA:  Status post total knee arthroplasty 05/19/2016, knee pain, fever EXAM: CHEST  2 VIEW COMPARISON:  05/05/2016 FINDINGS: Cardiomediastinal silhouette is stable. Hyperinflation again noted. Stable chronic mild bronchitic changes. Stable chronic mild interstitial prominence. No definite superimposed infiltrate or pulmonary edema. IMPRESSION: No active disease.  Stable COPD. Electronically Signed   By: Lahoma Crocker M.D.   On: 05/21/2016 08:33   Dg Chest 2 View  Result Date: 05/05/2016 CLINICAL DATA:  Preop right knee surgery EXAM: CHEST  2 VIEW COMPARISON:  CT 06/16/2011 FINDINGS: There is hyperinflation of the lungs compatible with COPD. Heart and mediastinal contours are within normal limits. No focal opacities or effusions. No acute bony abnormality. Slight compression fracture involving a lower thoracic vertebral body, likely chronic. IMPRESSION: COPD.  No active cardiopulmonary disease. Electronically Signed   By: Rolm Baptise M.D.   On: 05/05/2016 14:09   Dg Knee Right Port  Result Date: 05/19/2016 CLINICAL DATA:  Postop EXAM: PORTABLE RIGHT KNEE - 1-2 VIEW COMPARISON:  None. FINDINGS: Total knee arthroplasty is in place. There is anatomic alignment of the osseous and prostatic structures. There is no breakage or loosening of the hardware. IMPRESSION: Total knee arthroplasty anatomically aligned. Electronically Signed   By: Marybelle Killings M.D.   On: 05/19/2016 11:29   Disposition: Plan will be for discharge home with HHPT on 05/22/16.  Follow-up with [facility] in 10-14 days for staple removal.  Follow-up Information     Judson Roch, PA-C Follow up in 14 day(s).   Specialty:  Physician Assistant Why:  Electa Sniff information: [place] [address] [phone]          Signed: Prescott Parma, Allante Beane PA-C 05/22/2016, 6:01 AM

## 2016-05-21 NOTE — Progress Notes (Signed)
Physical Therapy Treatment Patient Details Name: Melinda Little MRN: [id number] DOB: Nov 15, 1929 Today's Date: 05/21/2016    History of Present Illness Pt admitted for R TKR. Surgery performed 05/19/16. Pt with history of L TKR in 2005.     PT Comments    Pt is making good progress towards goals with improved ambulation pattern this session, able to progress to reciprocal gait. Still needs cues for sequencing and consistency. Good endurance with there-ex and progress with ROM. Plan to perform stair training in PM session.   Follow Up Recommendations  Home health PT     Equipment Recommendations       Recommendations for Other Services       Precautions / Restrictions Precautions Precautions: Fall;Knee Precaution Booklet Issued: Yes (comment) Restrictions Weight Bearing Restrictions: Yes RLE Weight Bearing: Weight bearing as tolerated    Mobility  Bed Mobility Overal bed mobility: Needs Assistance Bed Mobility: Supine to Sit     Supine to sit: Min guard     General bed mobility comments: safe technique performed with cues for sequencing and guidance of R LE to floor  Transfers Overall transfer level: Needs assistance Equipment used: Rolling walker (2 wheeled) Transfers: Sit to/from Stand Sit to Stand: Min guard         General transfer comment: safe technique for all transfers with RW. Upright posture noted with no LOB.  Ambulation/Gait Ambulation/Gait assistance: Supervision Ambulation Distance (Feet): 200 Feet Assistive device: Rolling walker (2 wheeled) Gait Pattern/deviations: Step-through pattern     General Gait Details: Progressed to reciprocal gait pattern and improved fluid technique. Still needs heavy cues for sequencing and symmetrical steps. Safe technique with no fatigue noted. Cues for upright posture and gaze in forward direction   Stairs            Wheelchair Mobility    Modified Rankin (Stroke Patients Only)       Balance                                     Cognition Arousal/Alertness: Awake/alert Behavior During Therapy: WFL for tasks assessed/performed Overall Cognitive Status: Within Functional Limits for tasks assessed                      Exercises Total Joint Exercises Goniometric ROM: R knee AAROM: 1-81 degrees Other Exercises Other Exercises: Pt performed R knee ther-ex including ankle pumps, quad sets, LAQ, knee flexion stretches, SLRs, SAQ, and hip abd/add. All therex performed in supine position with cga 15 reps.    General Comments        Pertinent Vitals/Pain Pain Assessment: 0-10 Pain Score: 4  Pain Location: R knee Pain Descriptors / Indicators: Operative site guarding Pain Intervention(s): Limited activity within patient's tolerance;Ice applied;Repositioned;Premedicated before session    Home Living                      Prior Function            PT Goals (current goals can now be found in the care plan section) Acute Rehab PT Goals Patient Stated Goal: to go home PT Goal Formulation: With patient Time For Goal Achievement: 06/02/16 Potential to Achieve Goals: Good Progress towards PT goals: Progressing toward goals    Frequency    BID      PT Plan Current plan remains appropriate    Co-evaluation  End of Session Equipment Utilized During Treatment: Gait belt Activity Tolerance: Patient tolerated treatment well Patient left: in chair;with chair alarm set     Time: [id number] PT Time Calculation (min) (ACUTE ONLY): 31 min  Charges:  $Gait Training: 8-22 mins $Therapeutic Exercise: 8-22 mins                    G Codes:      Batool Majid 06-04-2016, 12:05 PM  Greggory Stallion, PT, DPT [phone]

## 2016-05-21 NOTE — Discharge Instructions (Signed)

## 2016-05-21 NOTE — Progress Notes (Signed)
  Subjective: 2 Days Post-Op Procedure(s) (LRB): TOTAL KNEE ARTHROPLASTY (Right) Patient reports pain as mild.   Patient is well, but has had some minor complaints of fever Plan is to go Home after hospital stay. Negative for chest pain and shortness of breath Fever: Yes, 100.4 last night and most recent temp 99.8 Gastrointestinal:Postive for nausea yesterday following surgery.  Objective: Vital signs in last 24 hours: Temp:  [98.4 F (36.9 C)-100.4 F (38 C)] 99.8 F (37.7 C) (11/09 0429) Pulse Rate:  [77-97] 97 (11/09 0429) Resp:  [15-19] 19 (11/09 0429) BP: (128-150)/(47-56) 128/51 (11/09 0429) SpO2:  [95 %-97 %] 95 % (11/09 0429)  Intake/Output from previous day:  Intake/Output Summary (Last 24 hours) at 05/21/16 0721 Last data filed at 05/20/16 1800  Gross per 24 hour  Intake              720 ml  Output                0 ml  Net              720 ml    Intake/Output this shift: No intake/output data recorded.  Labs:  Recent Labs  05/20/16 0544 05/21/16 0318  HGB 10.6* 9.7*    Recent Labs  05/20/16 0544 05/21/16 0318  WBC 10.1 12.4*  RBC 3.34* 3.07*  HCT 30.8* 27.9*  PLT 152 144*    Recent Labs  05/20/16 0544 05/21/16 0318  NA 133* 132*  K 3.8 3.1*  CL 106 106  CO2 20* 19*  BUN 13 11  CREATININE 0.47 0.40*  GLUCOSE 187* 124*  CALCIUM 8.3* 8.1*   No results for input(s): LABPT, INR in the last 72 hours.   EXAM General - Patient is Alert, Appropriate and Oriented Extremity - ABD soft Sensation intact distally Intact pulses distally Dorsiflexion/Plantar flexion intact Incision: dressing C/D/I No cellulitis present Dressing/Incision - clean, dry, no drainage Motor Function - intact, moving foot and toes well on exam.   Abdomen soft with normal BS.  Past Medical History:  Diagnosis Date  . Anemia   . Breast cancer, right [facility]) 2012   right breast with lumpectomy and mammosite rad tx  . Heart murmur   . Hypertension   . Pre-diabetes      Assessment/Plan: 2 Days Post-Op Procedure(s) (LRB): TOTAL KNEE ARTHROPLASTY (Right) Active Problems:   Status post total right knee replacement using cement  Estimated body mass index is 22.33 kg/m as calculated from the following:   Height as of this encounter: 5\' 4"  (1.626 m).   Weight as of this encounter: 59 kg (130 lb 1.1 oz). Up with therapy  Labs reviewed, Na 132, encouraged PO intake. Hg 9.7. K+ 3.1 this AM, will supplement with Klor-Con Pt running low grade fever and WBC up to 12.4 this AM.  Will place order for CXR and UA, encouraged continued incentive spirometer use. CBC and BMP ordered for tomorrow morning. Begin working on [employer] today. PT currently recommending HHPT, Care management to assist with discharge. Plan will be for possible discharge tomorrow pending PT and condition.  DVT Prophylaxis - Lovenox, Foot Pumps and TED hose Weight-Bearing as tolerated to right leg  J. Cameron Proud, PA-C [facility] Orthopaedic Surgery 05/21/2016, 7:21 AM

## 2016-05-22 ENCOUNTER — Other Ambulatory Visit: Payer: Medicare Other

## 2016-05-22 ENCOUNTER — Ambulatory Visit: Payer: Medicare Other

## 2016-05-22 LAB — CBC
HEMATOCRIT: 26.8 % — AB (ref 35.0–47.0)
Hemoglobin: 9.1 g/dL — ABNORMAL LOW (ref 12.0–16.0)
MCH: 31.3 pg (ref 26.0–34.0)
MCHC: 33.9 g/dL (ref 32.0–36.0)
MCV: 92.3 fL (ref 80.0–100.0)
Platelets: 138 10*3/uL — ABNORMAL LOW (ref 150–440)
RBC: 2.9 MIL/uL — AB (ref 3.80–5.20)
RDW: 13.2 % (ref 11.5–14.5)
WBC: 10.1 10*3/uL (ref 3.6–11.0)

## 2016-05-22 LAB — BASIC METABOLIC PANEL
ANION GAP: 6 (ref 5–15)
BUN: 10 mg/dL (ref 6–20)
CHLORIDE: 104 mmol/L (ref 101–111)
CO2: 20 mmol/L — ABNORMAL LOW (ref 22–32)
Calcium: 7.8 mg/dL — ABNORMAL LOW (ref 8.9–10.3)
Creatinine, Ser: 0.43 mg/dL — ABNORMAL LOW (ref 0.44–1.00)
GFR calc non Af Amer: >60 mL/min (ref >60)
Glucose, Bld: 118 mg/dL — ABNORMAL HIGH (ref 65–99)
POTASSIUM: 4 mmol/L (ref 3.5–5.1)
SODIUM: 130 mmol/L — AB (ref 135–145)

## 2016-05-22 NOTE — Plan of Care (Signed)
Problem: Education: Goal: Knowledge of [facility] Education information/materials will improve Outcome: Progressing VSS, free of falls during shift.  Reported R knee pain 5/10, received PRN Tramadol 50mg , asleep on reassessment and asleep for majority of shift.  No other complaints overnight.  Bed alarm on, bed in low position.  Call bell within reach, [place].

## 2016-05-22 NOTE — Progress Notes (Signed)
  Subjective: 3 Days Post-Op Procedure(s) (LRB): TOTAL KNEE ARTHROPLASTY (Right) Patient reports pain as mild.   Patient is well, but has had some minor complaints of fever Plan is to go Home after hospital stay. Negative for chest pain and shortness of breath Fever: Yes, 99.4 last night Gastrointestinal: Negative for nausea.  Objective: Vital signs in last 24 hours: Temp:  [98.4 F (36.9 C)-99.5 F (37.5 C)] 99.4 F (37.4 C) (11/10 0351) Pulse Rate:  [94-96] 95 (11/10 0351) Resp:  [18-19] 19 (11/10 0351) BP: (115-146)/(51-121) 128/53 (11/10 0351) SpO2:  [96 %-99 %] 96 % (11/10 0351)  Intake/Output from previous day:  Intake/Output Summary (Last 24 hours) at 05/22/16 0557 Last data filed at 05/21/16 1800  Gross per 24 hour  Intake              480 ml  Output                0 ml  Net              480 ml    Intake/Output this shift: No intake/output data recorded.  Labs:  Recent Labs  05/20/16 0544 05/21/16 0318 05/22/16 0254  HGB 10.6* 9.7* 9.1*    Recent Labs  05/21/16 0318 05/22/16 0254  WBC 12.4* 10.1  RBC 3.07* 2.90*  HCT 27.9* 26.8*  PLT 144* 138*    Recent Labs  05/21/16 0318 05/21/16 1452 05/22/16 0254  NA 132*  --  130*  K 3.1* 4.2 4.0  CL 106  --  104  CO2 19*  --  20*  BUN 11  --  10  CREATININE 0.40*  --  0.43*  GLUCOSE 124*  --  118*  CALCIUM 8.1*  --  7.8*   No results for input(s): LABPT, INR in the last 72 hours.   EXAM General - Patient is Alert, Appropriate and Oriented Extremity - ABD soft Sensation intact distally Intact pulses distally Dorsiflexion/Plantar flexion intact Incision: dressing C/D/I No cellulitis present Dressing/Incision - clean, dry, no drainage Motor Function - intact, moving foot and toes well on exam.   Abdomen soft with normal BS.  Past Medical History:  Diagnosis Date  . Anemia   . Breast cancer, right [facility]) 2012   right breast with lumpectomy and mammosite rad tx  . Heart murmur   .  Hypertension   . Pre-diabetes     Assessment/Plan: 3 Days Post-Op Procedure(s) (LRB): TOTAL KNEE ARTHROPLASTY (Right) Active Problems:   Status post total right knee replacement using cement  Estimated body mass index is 22.33 kg/m as calculated from the following:   Height as of this encounter: 5\' 4"  (1.626 m).   Weight as of this encounter: 59 kg (130 lb 1.1 oz). Up with therapy  Positive for a bowel movement yesterday. WBC level normal. Normal urinalysis and normal chest x-ray.  PT currently recommending HHPT, Care management to assist with discharge. Plan will be for possible discharge today pending PT and condition.  DVT Prophylaxis - Lovenox, Foot Pumps and TED hose Weight-Bearing as tolerated to right leg  Reche Dixon, PA-C [facility] Orthopaedic Surgery 05/22/2016, 5:57 AM

## 2016-05-22 NOTE — Progress Notes (Signed)
Physical Therapy Treatment Patient Details Name: Melinda Little MRN: [id number] DOB: [birth year] Today's Date: 05/22/2016    History of Present Illness Pt admitted for R TKR. Surgery performed 05/19/16. Pt with history of L TKR in 2005.     PT Comments    Pt is making good progress towards goals and is ready for discharge this date. Good progress made for ROM this date. Pt able to ambulate around RN station with improved technique this date.   Follow Up Recommendations  Home health PT     Equipment Recommendations       Recommendations for Other Services       Precautions / Restrictions Precautions Precautions: Fall;Knee Precaution Booklet Issued: Yes (comment) Restrictions Weight Bearing Restrictions: Yes RLE Weight Bearing: Weight bearing as tolerated    Mobility  Bed Mobility Overal bed mobility: Needs Assistance Bed Mobility: Supine to Sit     Supine to sit: Supervision     General bed mobility comments: safe technique performed with cues given. Pt does not need therapist for assistance  Transfers Overall transfer level: Needs assistance Equipment used: Rolling walker (2 wheeled) Transfers: Sit to/from Stand Sit to Stand: Min guard         General transfer comment: safe technique for all transfers with RW. Upright posture noted with no LOB.  Ambulation/Gait Ambulation/Gait assistance: Min guard Ambulation Distance (Feet): 200 Feet Assistive device: Rolling walker (2 wheeled) Gait Pattern/deviations: Step-through pattern     General Gait Details: Able to performed reciprocal gait pattern this date with improved gait speed. Fluid gait pattern with upright posture noted. Limited verbal cues required   Stairs            Wheelchair Mobility    Modified Rankin (Stroke Patients Only)       Balance                                    Cognition Arousal/Alertness: Awake/alert Behavior During Therapy: WFL for tasks  assessed/performed Overall Cognitive Status: Within Functional Limits for tasks assessed                      Exercises Total Joint Exercises Goniometric ROM: R knee AAROM: 0-90 degrees Other Exercises Other Exercises: Supine ther-ex performed on R LE including ankle pumps, quad sets, SLRs, and seated knee flexion stretches. All ther-ex performed x 15 reps with cga.    General Comments        Pertinent Vitals/Pain Pain Assessment: 0-10 Pain Score: 4  Pain Location: R knee Pain Descriptors / Indicators: Operative site guarding Pain Intervention(s): Limited activity within patient's tolerance    Home Living                      Prior Function            PT Goals (current goals can now be found in the care plan section) Acute Rehab PT Goals Patient Stated Goal: to go home PT Goal Formulation: With patient Time For Goal Achievement: 06/02/16 Potential to Achieve Goals: Good Progress towards PT goals: Progressing toward goals    Frequency    BID      PT Plan Current plan remains appropriate    Co-evaluation             End of Session Equipment Utilized During Treatment: Gait belt Activity Tolerance: Patient tolerated treatment well Patient left: in  chair;with chair alarm set     Time: [id number] PT Time Calculation (min) (ACUTE ONLY): 23 min  Charges:  $Gait Training: 8-22 mins $Therapeutic Exercise: 8-22 mins                    G Codes:      Leaf [employer] Jun 02, 2016, 10:18 AM  Greggory Stallion, PT, DPT [phone]

## 2016-05-22 NOTE — Discharge Planning (Signed)
Patient IVx2 removed.  Discharge papers given, explained and educated.  Patient has FU with Drs. Made and understands how to look for s/sx of infection at surgical site.  Given pain script. Dressing changed. RN assessment and VS revealed stability for DC to home with [facility].(PT). When ready, will be wheeled to front and family transporting home via car.

## 2016-05-22 NOTE — Care Management Note (Signed)
Case Management Note  Patient Details  Name: Melinda Little MRN: [id number] Date of Birth: [birth year]  Subjective/Objective:  Discharging today. Home with home health                  Action/Plan: [facility] notified of discharge.  Expected Discharge Date:    05/22/16            Expected Discharge Plan:  [place]  In-House Referral:     Discharge planning Services  CM Consult  Post Acute Care Choice:  Home Health Choice offered to:  Patient  DME Arranged:    DME Agency:     HH Arranged:  PT [place]:  [facility] (now [facility])  Status of Service:  Completed, signed off  If discussed at [employer] of Stay Meetings, dates discussed:    Additional Comments:  Jolly Mango, RN 05/22/2016, 8:56 AM

## 2016-05-22 NOTE — Care Management Important Message (Signed)
Important Message  Patient Details  Name: Melinda Little MRN: [id number] Date of Birth: Nov 17, 1929   Medicare Important Message Given:  Yes    Jolly Mango, RN 05/22/2016, 8:51 AM

## 2016-07-16 ENCOUNTER — Ambulatory Visit: Payer: Medicare Other

## 2016-07-16 ENCOUNTER — Other Ambulatory Visit: Payer: Medicare Other

## 2016-07-24 ENCOUNTER — Ambulatory Visit
Admission: RE | Admit: 2016-07-24 | Discharge: 2016-07-24 | Disposition: A | Discharge status: Home / Self Care | Payer: Medicare Other | Source: Ambulatory Visit | Attending: Internal Medicine | Admitting: Internal Medicine

## 2016-07-24 ENCOUNTER — Other Ambulatory Visit: Payer: Self-pay | Admitting: Internal Medicine

## 2016-07-24 DIAGNOSIS — Z08 Encounter for follow-up examination after completed treatment for malignant neoplasm: Secondary | ICD-10-CM | POA: Insufficient documentation

## 2016-07-24 DIAGNOSIS — Z853 Personal history of malignant neoplasm of breast: Secondary | ICD-10-CM | POA: Diagnosis not present

## 2016-07-24 DIAGNOSIS — C50911 Malignant neoplasm of unspecified site of right female breast: Secondary | ICD-10-CM

## 2016-07-24 DIAGNOSIS — D649 Anemia, unspecified: Secondary | ICD-10-CM | POA: Diagnosis not present

## 2016-10-30 ENCOUNTER — Inpatient Hospital Stay: Payer: Medicare Other | Attending: Internal Medicine | Admitting: Internal Medicine

## 2016-10-30 ENCOUNTER — Inpatient Hospital Stay: Payer: Medicare Other

## 2016-10-30 DIAGNOSIS — C50911 Malignant neoplasm of unspecified site of right female breast: Secondary | ICD-10-CM

## 2016-10-30 DIAGNOSIS — R7303 Prediabetes: Secondary | ICD-10-CM | POA: Diagnosis not present

## 2016-10-30 DIAGNOSIS — Z170 Estrogen receptor positive status [ER+]: Secondary | ICD-10-CM

## 2016-10-30 DIAGNOSIS — D0511 Intraductal carcinoma in situ of right breast: Secondary | ICD-10-CM | POA: Insufficient documentation

## 2016-10-30 DIAGNOSIS — Z87891 Personal history of nicotine dependence: Secondary | ICD-10-CM | POA: Insufficient documentation

## 2016-10-30 DIAGNOSIS — I10 Essential (primary) hypertension: Secondary | ICD-10-CM | POA: Diagnosis not present

## 2016-10-30 DIAGNOSIS — Z853 Personal history of malignant neoplasm of breast: Secondary | ICD-10-CM

## 2016-10-30 DIAGNOSIS — Z7982 Long term (current) use of aspirin: Secondary | ICD-10-CM | POA: Diagnosis not present

## 2016-10-30 DIAGNOSIS — Z923 Personal history of irradiation: Secondary | ICD-10-CM

## 2016-10-30 DIAGNOSIS — D649 Anemia, unspecified: Secondary | ICD-10-CM | POA: Diagnosis not present

## 2016-10-30 LAB — COMPREHENSIVE METABOLIC PANEL
ALT: 10 U/L — ABNORMAL LOW (ref 14–54)
AST: 15 U/L (ref 15–41)
Albumin: 4.1 g/dL (ref 3.5–5.0)
Alkaline Phosphatase: 66 U/L (ref 38–126)
Anion gap: 6 (ref 5–15)
BUN: 14 mg/dL (ref 6–20)
CHLORIDE: 104 mmol/L (ref 101–111)
CO2: 23 mmol/L (ref 22–32)
Calcium: 9.1 mg/dL (ref 8.9–10.3)
Creatinine, Ser: 0.59 mg/dL (ref 0.44–1.00)
Glucose, Bld: 116 mg/dL — ABNORMAL HIGH (ref 65–99)
Potassium: 3.6 mmol/L (ref 3.5–5.1)
SODIUM: 133 mmol/L — AB (ref 135–145)
Total Bilirubin: 0.6 mg/dL (ref 0.3–1.2)
Total Protein: 6.6 g/dL (ref 6.5–8.1)

## 2016-10-30 LAB — CBC WITH DIFFERENTIAL/PLATELET
BASOS ABS: 0 10*3/uL (ref 0–0.1)
Basophils Relative: 0 %
EOS ABS: 0.1 10*3/uL (ref 0–0.7)
EOS PCT: 1 %
HCT: 33.5 % — ABNORMAL LOW (ref 35.0–47.0)
Hemoglobin: 11.4 g/dL — ABNORMAL LOW (ref 12.0–16.0)
LYMPHS PCT: 16 %
Lymphs Abs: 1 10*3/uL (ref 1.0–3.6)
MCH: 30.5 pg (ref 26.0–34.0)
MCHC: 34.1 g/dL (ref 32.0–36.0)
MCV: 89.5 fL (ref 80.0–100.0)
MONO ABS: 0.6 10*3/uL (ref 0.2–0.9)
Monocytes Relative: 10 %
Neutro Abs: 4.5 10*3/uL (ref 1.4–6.5)
Neutrophils Relative %: 73 %
PLATELETS: 241 10*3/uL (ref 150–440)
RBC: 3.74 MIL/uL — AB (ref 3.80–5.20)
RDW: 14.2 % (ref 11.5–14.5)
WBC: 6.1 10*3/uL (ref 3.6–11.0)

## 2016-10-30 NOTE — Assessment & Plan Note (Addendum)
#   DCIS of RIGHT BREAST s/p Lumpec  [Dr.Crystal] not on Tamoxifen sec to Intol. Last mammo-Jan 2018-WNL. Patient will continue mammograms on a yearly basis. Patient interested in following up with PCP.   # Anemia- today-hemoglobin-11.4/improved; In March 2017- stool occult- positive. Discussed re: colonoscopy. Pt not too keen.   # follow up with [place] as needed. pt agreement with the plan.   CC: Dr. Netty Starring

## 2016-10-30 NOTE — Progress Notes (Signed)
Pt reports 10-12 lb weight loss. Pt has been re-cooperating from knee surgery and has been going to rehab. Patient reports decrease in appetite during this time.  Pt denies any N&V or change in bowel habits. Pt denies any chest pain, shortness of breath.   RN Chaperoned provider with Breast Exam

## 2016-10-30 NOTE — Progress Notes (Signed)
[place] OFFICE PROGRESS NOTE  Patient Care Team: Dion Body, MD as PCP - General (Family Medicine)   SUMMARY OF ONCOLOGIC HISTORY: Oncology History   # 2012- DCIS of RIGHT BREAST s/p Lumpec  [Dr.Crystal]; Jan 2013- START Tam; April 2017-STOP TAMOXIFEN  # July 2015  Anemia mild [~11]     Ductal carcinoma in situ (DCIS) of right breast     INTERVAL HISTORY:  83 year old very pleasant Caucasian female patient with above history of DCIS is here for follow-up.  Patient's appetite is good. Denies any weight loss. Denies any lumps or bumps. No nausea no vomiting. No blood in stools black stools. She had colonoscopy many years ago.   She is concerned about the blood clots and strokes from tamoxifen. Denies blood in stools or black stools.   REVIEW OF SYSTEMS:  A complete 10 point review of system is done which is negative except mentioned above/history of present illness.   PAST MEDICAL HISTORY :  Past Medical History:  Diagnosis Date  . Anemia   . Breast cancer ([place]) 2012   right breast/mammosite/radiation  . Breast cancer, right [facility]) 2012   right breast with lumpectomy and mammosite rad tx  . Heart murmur   . Hypertension   . Pre-diabetes     PAST SURGICAL HISTORY :   Past Surgical History:  Procedure Laterality Date  . ABDOMINAL HYSTERECTOMY    . BREAST BIOPSY Right 2012   positve  . BREAST BIOPSY Right 2012   negative  . BREAST LUMPECTOMY    . COLON SURGERY    . JOINT REPLACEMENT     left knee 2005  . PARTIAL HYSTERECTOMY  1978  . TOTAL KNEE ARTHROPLASTY Right 05/19/2016   Procedure: TOTAL KNEE ARTHROPLASTY;  Surgeon: Corky Mull, MD;  Location: [facility] ORS;  Service: Orthopedics;  Laterality: Right;    FAMILY HISTORY :  No family history on file.  SOCIAL HISTORY:   Social History  Substance Use Topics  . Smoking status: Former Research scientist (life sciences)  . Smokeless tobacco: Never Used  . Alcohol use No    ALLERGIES:  is allergic to  ciprofloxacin.  MEDICATIONS:  Current Outpatient Prescriptions  Medication Sig Dispense Refill  . aspirin EC 81 MG tablet Take by mouth daily.     . cholecalciferol (VITAMIN D) 400 units TABS tablet Take 400 Units by mouth daily.    . ferrous sulfate 325 (65 FE) MG tablet Take 325 mg by mouth 2 (two) times daily with a meal.     . Glucosamine-Chondroitin (OSTEO BI-FLEX REGULAR STRENGTH PO) Take 1 tablet by mouth daily.    Marland Kitchen ibuprofen (ADVIL,MOTRIN) 200 MG tablet Take 400 mg by mouth daily.     . Probiotic Product (PROBIOTIC FORMULA PO) Take 1 capsule by mouth daily.    Marland Kitchen lisinopril (PRINIVIL,ZESTRIL) 10 MG tablet TAKE 1 TABLET BY MOUTH EVERY DAY     No current facility-administered medications for this visit.     PHYSICAL EXAMINATION:   BP (!) 180/76 (Patient Position: Sitting)   Pulse 78   Temp 97.6 F (36.4 C) (Tympanic)   Resp 18   Ht 5\' 4"  (1.626 m)   Wt 118 lb 8 oz (53.8 kg)   BMI 20.34 kg/m   Filed Weights   10/30/16 1034  Weight: 118 lb 8 oz (53.8 kg)    GENERAL: Well-nourished well-developed; Alert, no distress and comfortable.  Alone. She walks with a cane. EYES: no pallor or icterus OROPHARYNX: no thrush or  ulceration; good dentition  NECK: supple, no masses felt LYMPH:  no palpable lymphadenopathy in the cervical, axillary or inguinal regions LUNGS: clear to auscultation and  No wheeze or crackles HEART/CVS: regular rate & rhythm and no murmurs; No lower extremity edema ABDOMEN:abdomen soft, non-tender and normal bowel sounds Musculoskeletal:no cyanosis of digits and no clubbing  PSYCH: alert & oriented x 3 with fluent speech NEURO: no focal motor/sensory deficits SKIN:  no rashes or significant lesions Right and left BREAST exam [in the presence of nurse]- no unusual skin changes or dominant masses felt. Telangiectasia from radiation noted. Surgical scars noted.    LABORATORY DATA:  I have reviewed the data as listed    Component Value Date/Time   NA  133 (L) 10/30/2016 1005   NA 139 07/17/2011 1003   K 3.6 10/30/2016 1005   K 4.0 07/17/2011 1003   CL 104 10/30/2016 1005   CL 102 07/17/2011 1003   CO2 23 10/30/2016 1005   CO2 31 07/17/2011 1003   GLUCOSE 116 (H) 10/30/2016 1005   GLUCOSE 117 (H) 07/17/2011 1003   BUN 14 10/30/2016 1005   BUN 17 07/17/2011 1003   CREATININE 0.59 10/30/2016 1005   CREATININE 0.61 10/24/2014 1039   CALCIUM 9.1 10/30/2016 1005   CALCIUM 9.9 07/17/2011 1003   PROT 6.6 10/30/2016 1005   PROT 6.4 (L) 10/24/2014 1039   ALBUMIN 4.1 10/30/2016 1005   ALBUMIN 3.6 10/24/2014 1039   AST 15 10/30/2016 1005   AST 15 10/24/2014 1039   ALT 10 (L) 10/30/2016 1005   ALT 13 (L) 10/24/2014 1039   ALKPHOS 66 10/30/2016 1005   ALKPHOS 37 (L) 10/24/2014 1039   BILITOT 0.6 10/30/2016 1005   BILITOT 0.5 10/24/2014 1039   GFRNONAA >60 10/30/2016 1005   GFRNONAA >60 10/24/2014 1039   GFRAA >60 10/30/2016 1005   GFRAA >60 10/24/2014 1039    No results found for: SPEP, UPEP  Lab Results  Component Value Date   WBC 6.1 10/30/2016   NEUTROABS 4.5 10/30/2016   HGB 11.4 (L) 10/30/2016   HCT 33.5 (L) 10/30/2016   MCV 89.5 10/30/2016   PLT 241 10/30/2016      Chemistry      Component Value Date/Time   NA 133 (L) 10/30/2016 1005   NA 139 07/17/2011 1003   K 3.6 10/30/2016 1005   K 4.0 07/17/2011 1003   CL 104 10/30/2016 1005   CL 102 07/17/2011 1003   CO2 23 10/30/2016 1005   CO2 31 07/17/2011 1003   BUN 14 10/30/2016 1005   BUN 17 07/17/2011 1003   CREATININE 0.59 10/30/2016 1005   CREATININE 0.61 10/24/2014 1039      Component Value Date/Time   CALCIUM 9.1 10/30/2016 1005   CALCIUM 9.9 07/17/2011 1003   ALKPHOS 66 10/30/2016 1005   ALKPHOS 37 (L) 10/24/2014 1039   AST 15 10/30/2016 1005   AST 15 10/24/2014 1039   ALT 10 (L) 10/30/2016 1005   ALT 13 (L) 10/24/2014 1039   BILITOT 0.6 10/30/2016 1005   BILITOT 0.5 10/24/2014 1039        ASSESSMENT & PLAN:   Ductal carcinoma in situ (DCIS)  of right breast # DCIS of RIGHT BREAST s/p Lumpec  [Dr.Crystal]  not on Tamoxifen sec to Intol. Last mammo-Jan 2018-WNL. Patient will continue mammograms on a yearly basis. Patient interested in following up with PCP.   # Anemia- today-hemoglobin-11.4/improved; In March 2017- stool occult- positive. Discussed re: colonoscopy. Pt not  too keen.   # follow up with [place] as needed. pt agreement with the plan.   CC: Dr. Retta Mac, MD 11/01/2016 6:40 PM

## 2018-03-17 ENCOUNTER — Encounter: Payer: Self-pay | Admitting: *Deleted

## 2018-03-22 ENCOUNTER — Ambulatory Visit
Admission: RE | Admit: 2018-03-22 | Discharge: 2018-03-22 | Disposition: A | Discharge status: Home / Self Care | Payer: Medicare Other | Source: Ambulatory Visit | Attending: Ophthalmology | Admitting: Ophthalmology

## 2018-03-22 ENCOUNTER — Other Ambulatory Visit: Payer: Self-pay

## 2018-03-22 ENCOUNTER — Ambulatory Visit: Payer: Medicare Other | Admitting: Anesthesiology

## 2018-03-22 ENCOUNTER — Encounter: Payer: Self-pay | Admitting: Anesthesiology

## 2018-03-22 ENCOUNTER — Encounter
Admission: RE | Disposition: A | Discharge status: Home / Self Care | Payer: Self-pay | Source: Ambulatory Visit | Attending: Ophthalmology

## 2018-03-22 DIAGNOSIS — R011 Cardiac murmur, unspecified: Secondary | ICD-10-CM | POA: Insufficient documentation

## 2018-03-22 DIAGNOSIS — Z79899 Other long term (current) drug therapy: Secondary | ICD-10-CM | POA: Diagnosis not present

## 2018-03-22 DIAGNOSIS — Z7982 Long term (current) use of aspirin: Secondary | ICD-10-CM | POA: Insufficient documentation

## 2018-03-22 DIAGNOSIS — Z881 Allergy status to other antibiotic agents status: Secondary | ICD-10-CM | POA: Diagnosis not present

## 2018-03-22 DIAGNOSIS — H2511 Age-related nuclear cataract, right eye: Secondary | ICD-10-CM | POA: Diagnosis not present

## 2018-03-22 DIAGNOSIS — Z96653 Presence of artificial knee joint, bilateral: Secondary | ICD-10-CM | POA: Diagnosis not present

## 2018-03-22 DIAGNOSIS — Z853 Personal history of malignant neoplasm of breast: Secondary | ICD-10-CM | POA: Insufficient documentation

## 2018-03-22 DIAGNOSIS — Z87891 Personal history of nicotine dependence: Secondary | ICD-10-CM | POA: Diagnosis not present

## 2018-03-22 DIAGNOSIS — I10 Essential (primary) hypertension: Secondary | ICD-10-CM | POA: Insufficient documentation

## 2018-03-22 DIAGNOSIS — R251 Tremor, unspecified: Secondary | ICD-10-CM | POA: Insufficient documentation

## 2018-03-22 HISTORY — DX: Dyspnea, unspecified: R06.00

## 2018-03-22 HISTORY — PX: CATARACT EXTRACTION W/PHACO: SHX586

## 2018-03-22 HISTORY — DX: Tremor, unspecified: R25.1

## 2018-03-22 HISTORY — DX: Pain, unspecified: R52

## 2018-03-22 SURGERY — PHACOEMULSIFICATION, CATARACT, WITH IOL INSERTION
Anesthesia: Monitor Anesthesia Care | Site: Eye | Laterality: Right | Wound class: "Clean "

## 2018-03-22 MED ORDER — POVIDONE-IODINE 5 % OP SOLN
OPHTHALMIC | Status: DC | PRN
  Administered 2018-03-22: 1 via OPHTHALMIC

## 2018-03-22 MED ORDER — EPINEPHRINE PF 1 MG/ML IJ SOLN
INTRAOCULAR | Status: DC | PRN
  Administered 2018-03-22: 1 mL via OPHTHALMIC

## 2018-03-22 MED ORDER — ARMC OPHTHALMIC DILATING DROPS
1.0000 "application " | OPHTHALMIC | Status: AC
  Administered 2018-03-22 (×3): 1 via OPHTHALMIC

## 2018-03-22 MED ORDER — NA CHONDROIT SULF-NA HYALURON 40-17 MG/ML IO SOLN
INTRAOCULAR | Status: DC | PRN
  Administered 2018-03-22: 1 mL via INTRAOCULAR

## 2018-03-22 MED ORDER — CARBACHOL 0.01 % IO SOLN
INTRAOCULAR | Status: DC | PRN
  Administered 2018-03-22: .5 mL via INTRAOCULAR

## 2018-03-22 MED ORDER — FENTANYL CITRATE (PF) 100 MCG/2ML IJ SOLN
INTRAMUSCULAR | Status: DC | PRN
  Administered 2018-03-22: 50 ug via INTRAVENOUS

## 2018-03-22 MED ORDER — LIDOCAINE HCL (PF) 4 % IJ SOLN
INTRAOCULAR | Status: DC | PRN
  Administered 2018-03-22: 2 mL via OPHTHALMIC

## 2018-03-22 MED ORDER — FENTANYL CITRATE (PF) 100 MCG/2ML IJ SOLN
INTRAMUSCULAR | Status: AC
Start: 2018-03-22 — End: 2018-03-22
  Filled 2018-03-22: qty 2

## 2018-03-22 MED ORDER — MOXIFLOXACIN HCL 0.5 % OP SOLN
OPHTHALMIC | Status: DC | PRN
  Administered 2018-03-22: .2 mL via OPHTHALMIC

## 2018-03-22 MED ORDER — MOXIFLOXACIN HCL 0.5 % OP SOLN: 1.0000 [drp] | OPHTHALMIC | Status: DC | PRN

## 2018-03-22 MED ORDER — MOXIFLOXACIN HCL 0.5 % OP SOLN
OPHTHALMIC | Status: AC
  Filled 2018-03-22: qty 3

## 2018-03-22 MED ORDER — ARMC OPHTHALMIC DILATING DROPS
OPHTHALMIC | Status: AC
  Administered 2018-03-22: 1 via OPHTHALMIC
  Filled 2018-03-22: qty 0.5

## 2018-03-22 MED ORDER — SODIUM CHLORIDE 0.9 % IV SOLN
INTRAVENOUS | Status: DC
  Administered 2018-03-22: 09:00:00 via INTRAVENOUS

## 2018-03-22 SURGICAL SUPPLY — 16 items
GLOVE BIO SURGEON STRL SZ8 (GLOVE) ×3 IMPLANT
GLOVE BIOGEL M 6.5 STRL (GLOVE) ×3 IMPLANT
GLOVE SURG LX 8.0 MICRO (GLOVE) ×2
GLOVE SURG LX STRL 8.0 MICRO (GLOVE) ×1 IMPLANT
GOWN STRL REUS W/ TWL LRG LVL3 (GOWN DISPOSABLE) ×2 IMPLANT
GOWN STRL REUS W/TWL LRG LVL3 (GOWN DISPOSABLE) ×4
LABEL CATARACT MEDS ST (LABEL) ×3 IMPLANT
LENS IOL TECNIS ITEC 21.5 (Intraocular Lens) ×2 IMPLANT
PACK CATARACT (MISCELLANEOUS) ×3 IMPLANT
PACK CATARACT BRASINGTON LX (MISCELLANEOUS) ×3 IMPLANT
PACK EYE AFTER SURG (MISCELLANEOUS) ×3 IMPLANT
SOL BSS BAG (MISCELLANEOUS) ×3
SOLUTION BSS BAG (MISCELLANEOUS) ×1 IMPLANT
SYR 5ML LL (SYRINGE) ×3 IMPLANT
WATER STERILE IRR 250ML POUR (IV SOLUTION) ×3 IMPLANT
WIPE NON LINTING 3.25X3.25 (MISCELLANEOUS) ×3 IMPLANT

## 2018-03-22 NOTE — Anesthesia Preprocedure Evaluation (Addendum)
Anesthesia Evaluation  Patient identified by MRN, date of birth, ID band Patient awake    Reviewed: Allergy & Precautions, NPO status , Patient's Chart, lab work & pertinent test results, reviewed documented beta blocker date and time   Airway Mallampati: II  TM Distance: >3 FB     Dental  (+) Upper Dentures, Lower Dentures   Pulmonary shortness of breath, former smoker,           Cardiovascular hypertension, Pt. on medications + Valvular Problems/Murmurs      Neuro/Psych    GI/Hepatic   Endo/Other    Renal/GU      Musculoskeletal   Abdominal   Peds  Hematology  (+) anemia ,   Anesthesia Other Findings   Reproductive/Obstetrics                            Anesthesia Physical Anesthesia Plan  ASA: III  Anesthesia Plan: MAC   Post-op Pain Management:    Induction:   PONV Risk Score and Plan:   Airway Management Planned:   Additional Equipment:   Intra-op Plan:   Post-operative Plan:   Informed Consent: I have reviewed the patients History and Physical, chart, labs and discussed the procedure including the risks, benefits and alternatives for the proposed anesthesia with the patient or authorized representative who has indicated his/her understanding and acceptance.     Plan Discussed with: CRNA  Anesthesia Plan Comments:         Anesthesia Quick Evaluation

## 2018-03-22 NOTE — Op Note (Signed)
PREOPERATIVE DIAGNOSIS:  Nuclear sclerotic cataract of the right eye.   POSTOPERATIVE DIAGNOSIS:  nuclear sclerotic cataract right eye   OPERATIVE PROCEDURE: Procedure(s): CATARACT EXTRACTION PHACO AND INTRAOCULAR LENS PLACEMENT (IOC)   SURGEON:  Birder Robson, MD.   ANESTHESIA:  Anesthesiologist: Gunnar Bulla, MD CRNA: Eben Burow, CRNA  1.      Managed anesthesia care. 2.      0.12ml of Shugarcaine was instilled in the eye following the paracentesis.   COMPLICATIONS:  None.   TECHNIQUE:   Stop and chop   DESCRIPTION OF PROCEDURE:  The patient was examined and consented in the preoperative holding area where the aforementioned topical anesthesia was applied to the right eye and then brought back to the Operating Room where the right eye was prepped and draped in the usual sterile ophthalmic fashion and a lid speculum was placed. A paracentesis was created with the side port blade and the anterior chamber was filled with viscoelastic. A near clear corneal incision was performed with the steel keratome. A continuous curvilinear capsulorrhexis was performed with a cystotome followed by the capsulorrhexis forceps. Hydrodissection and hydrodelineation were carried out with BSS on a blunt cannula. The lens was removed in a stop and chop  technique and the remaining cortical material was removed with the irrigation-aspiration handpiece. The capsular bag was inflated with viscoelastic and the Technis ZCB00  lens was placed in the capsular bag without complication. The remaining viscoelastic was removed from the eye with the irrigation-aspiration handpiece. The wounds were hydrated. The anterior chamber was flushed with Miostat and the eye was inflated to physiologic pressure. 0.47ml of Vigamox was placed in the anterior chamber. The wounds were found to be water tight. The eye was dressed with Vigamox. The patient was given protective glasses to wear throughout the day and a shield with which to  sleep tonight. The patient was also given drops with which to begin a drop regimen today and will follow-up with me in one day. Implant Name Type Inv. Item Serial No. Manufacturer Lot No. LRB No. Used  LENS IOL DIOP 21.5 - [id number] Intraocular Lens LENS IOL DIOP 21.5 [id number]  Right 1   Procedure(s) with comments: CATARACT EXTRACTION PHACO AND INTRAOCULAR LENS PLACEMENT (IOC) (Right) - [place] 01:18 AP% 19.0 CDE 14.95 Fluid pack lot # [id number]  Electronically signed: Birder Robson 03/22/2018 10:12 AM

## 2018-03-22 NOTE — Anesthesia Post-op Follow-up Note (Signed)
Anesthesia QCDR form completed.        

## 2018-03-22 NOTE — Discharge Instructions (Addendum)
Eye Surgery Discharge Instructions  Expect mild scratchy sensation or mild soreness. DO NOT RUB YOUR EYE!  The day of surgery:  Minimal physical activity, but bed rest is not required  No reading, computer work, or close hand work  No bending, lifting, or straining.  May watch TV  For 24 hours:  No driving, legal decisions, or alcoholic beverages  Safety precautions  Eat anything you prefer: It is better to start with liquids, then soup then solid foods.  Solar shield eyeglasses should be worn for comfort in the sunlight/patch while sleeping  Resume all regular medications including aspirin or Coumadin if these were discontinued prior to surgery. You may shower, bathe, shave, or wash your hair. Tylenol may be taken for mild discomfort. Follow eye drop instruction sheet as reviewed.  Call your doctor if you experience significant pain, nausea, or vomiting, fever > 101 or other signs of infection. [phone] or [phone] Specific instructions:  Follow-up Information    Birder Robson, MD Follow up.   Specialty:  Ophthalmology Why:  03/23/18 @ 10:45 am Contact information: [address] [place] [address] [phone]

## 2018-03-22 NOTE — Transfer of Care (Signed)
Immediate Anesthesia Transfer of Care Note  Patient: Melinda Little  Procedure(s) Performed: CATARACT EXTRACTION PHACO AND INTRAOCULAR LENS PLACEMENT (IOC) (Right Eye)  Patient Location: Short Stay  Anesthesia Type:MAC  Level of Consciousness: awake, alert , oriented and patient cooperative  Airway & Oxygen Therapy: Patient Spontanous Breathing  Post-op Assessment: Report given to RN and Post -op Vital signs reviewed and stable  Post vital signs: Reviewed and stable  Last Vitals:  Vitals Value Taken Time  BP    Temp    Pulse    Resp    SpO2      Last Pain:  Vitals:   03/22/18 0859  TempSrc: Temporal  PainSc: 0-No pain         Complications: No apparent anesthesia complications

## 2018-03-22 NOTE — Anesthesia Postprocedure Evaluation (Signed)
Anesthesia Post Note  Patient: Melinda Little  Procedure(s) Performed: CATARACT EXTRACTION PHACO AND INTRAOCULAR LENS PLACEMENT (IOC) (Right Eye)  Patient location during evaluation: Short Stay Anesthesia Type: MAC Level of consciousness: awake and alert, oriented and patient cooperative Pain management: pain level controlled Vital Signs Assessment: post-procedure vital signs reviewed and stable Respiratory status: spontaneous breathing, nonlabored ventilation and respiratory function stable Cardiovascular status: blood pressure returned to baseline and stable Postop Assessment: no headache and no apparent nausea or vomiting Anesthetic complications: no     Last Vitals:  Vitals:   03/22/18 0859 03/22/18 1015  BP: (!) 149/80 (!) 145/65  Pulse: 93 89  Resp: 17 16  Temp: (!) 36.3 C (!) 36.3 C  SpO2: 100% 98%    Last Pain:  Vitals:   03/22/18 1015  TempSrc: Temporal  PainSc: 0-No pain                 Eben Burow

## 2018-03-22 NOTE — H&P (Signed)
All labs reviewed. Abnormal studies sent to patients PCP when indicated.  Previous H&P reviewed, patient examined, there are NO CHANGES.  Melinda Mcquilkin Porfilio9/10/20199:47 AM

## 2018-03-23 ENCOUNTER — Encounter: Payer: Self-pay | Admitting: Ophthalmology

## 2018-04-05 IMAGING — CR DG CHEST 2V
1 series · 2 of 2 positions shown · non-contrast
Comparison: CT 06/16/2011

CLINICAL DATA: Preop right knee surgery

EXAM:
CHEST  2 VIEW

[Series 1: w chest pa · 0.14mm/px · 2 of 2 slices shown]
[im 1/2]
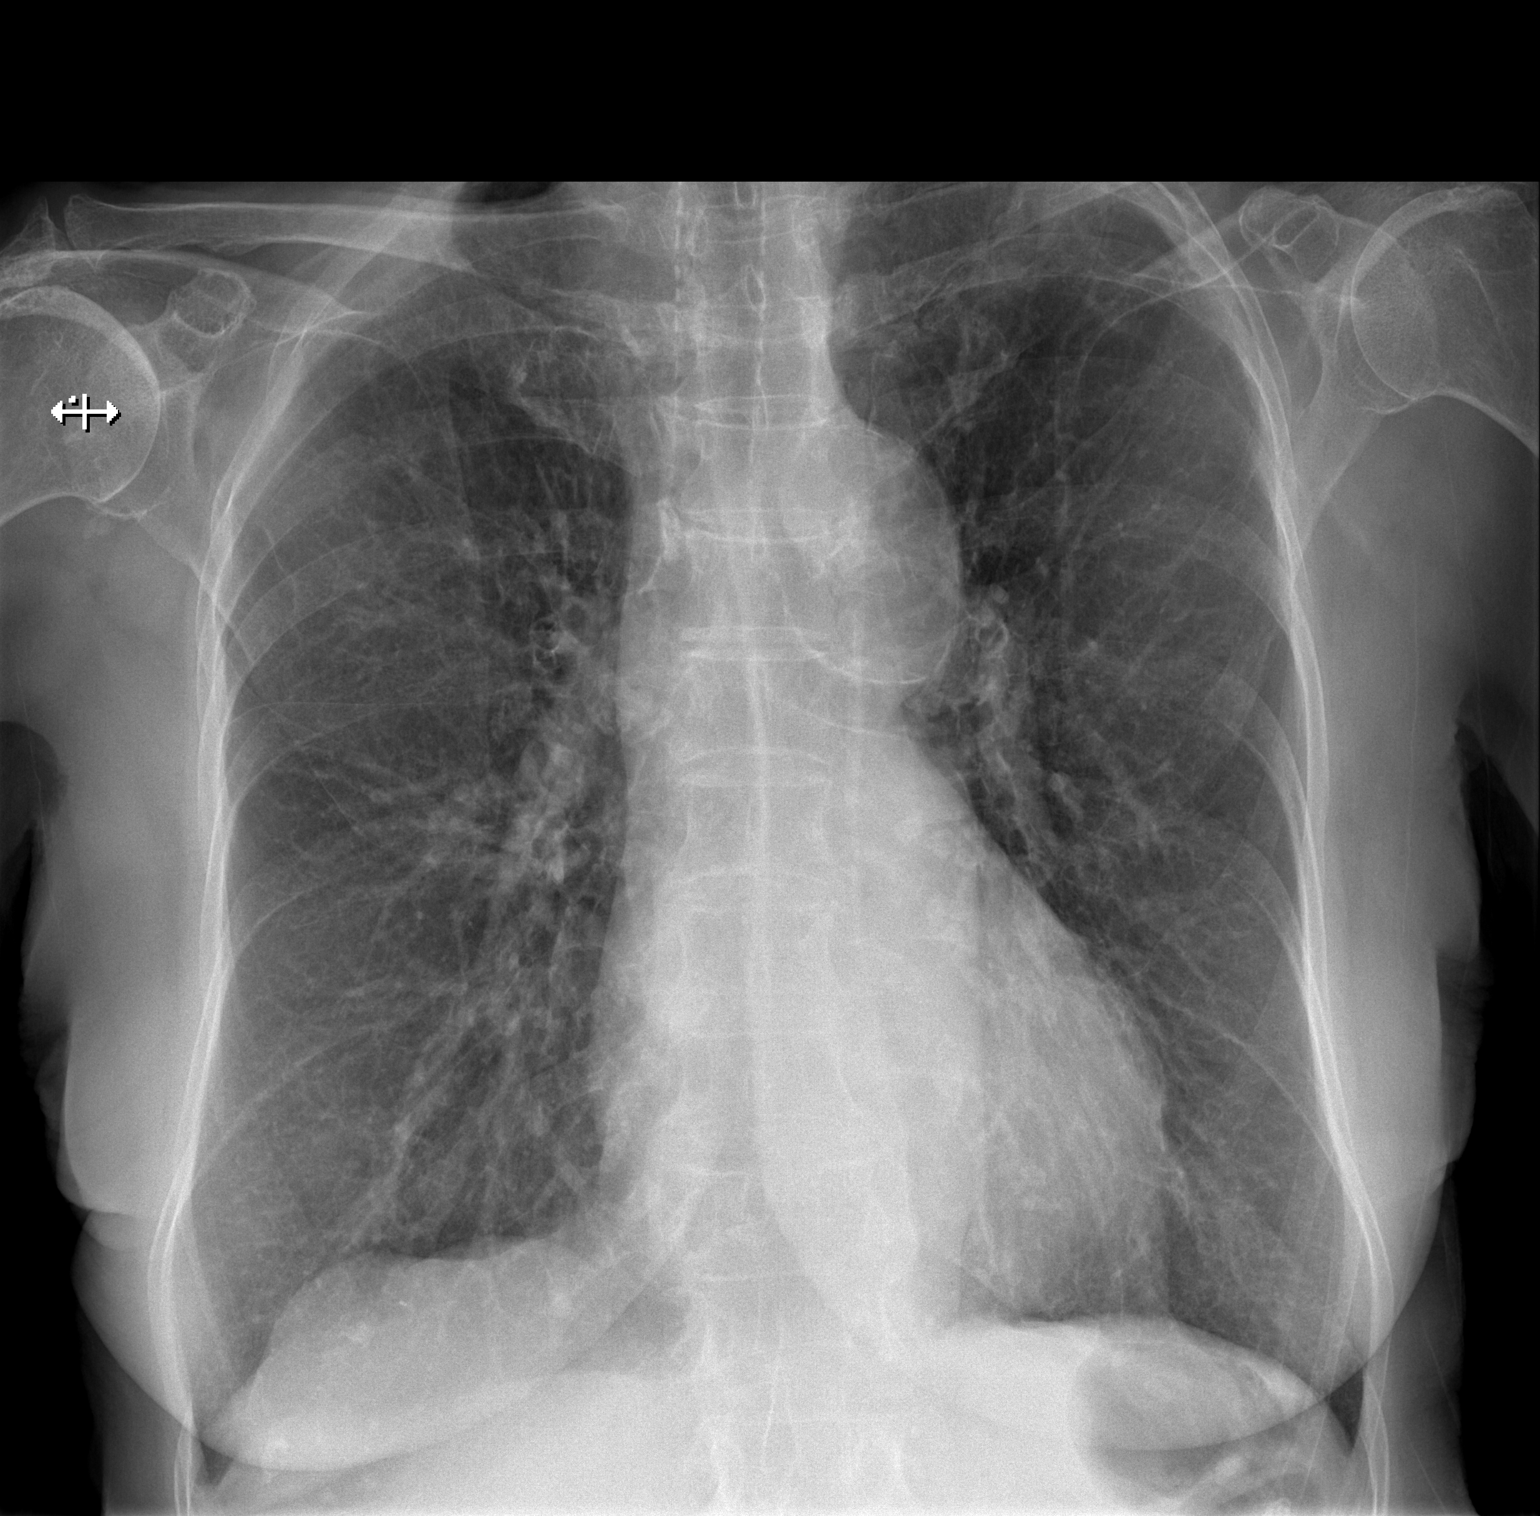
[im 2/2]
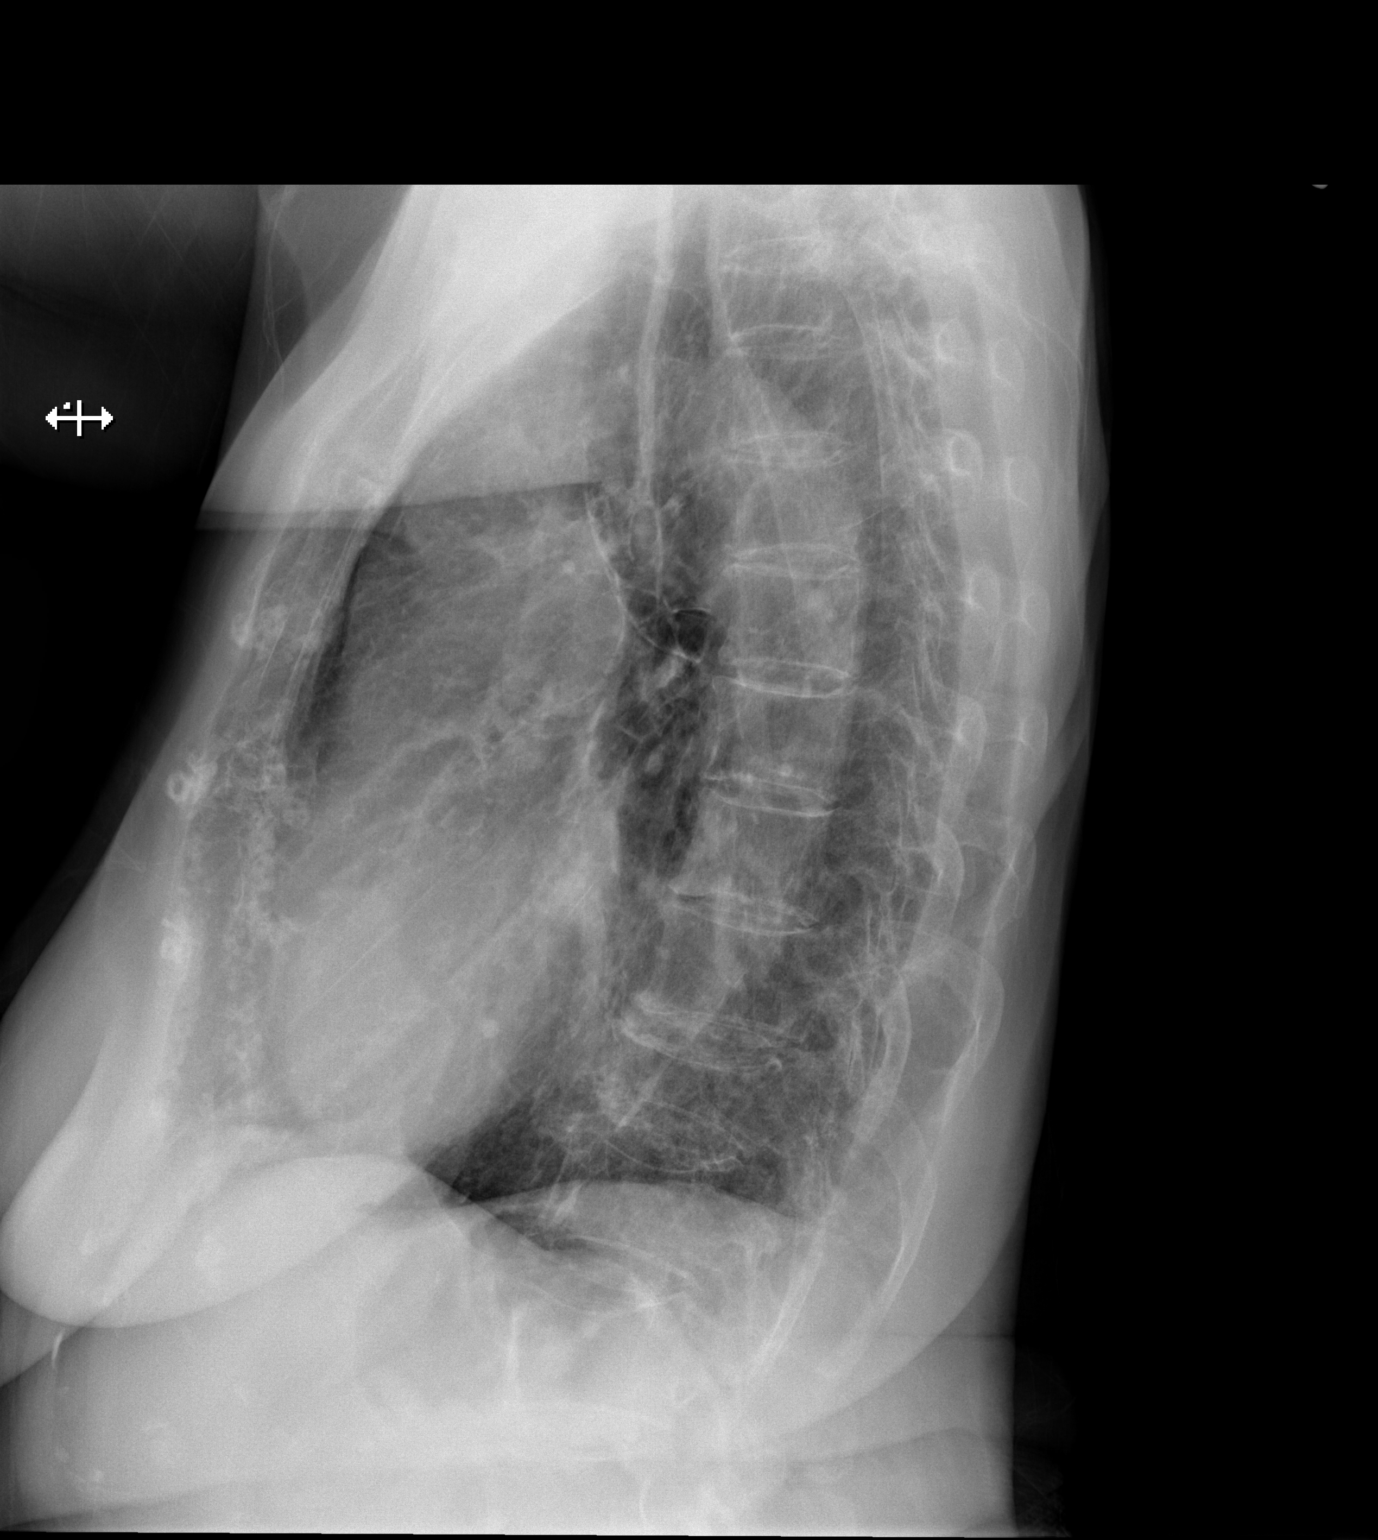

[2 of 2 positions shown; findings below may reference images not displayed]

FINDINGS: There is hyperinflation of the lungs compatible with COPD. Heart and
mediastinal contours are within normal limits. No focal opacities or
effusions. No acute bony abnormality. Slight compression fracture
involving a lower thoracic vertebral body, likely chronic.
IMPRESSION: COPD.  No active cardiopulmonary disease.

## 2018-04-06 ENCOUNTER — Encounter: Payer: Self-pay | Admitting: *Deleted

## 2018-04-12 ENCOUNTER — Ambulatory Visit: Payer: Medicare Other | Admitting: Anesthesiology

## 2018-04-12 ENCOUNTER — Ambulatory Visit
Admission: RE | Admit: 2018-04-12 | Discharge: 2018-04-12 | Disposition: A | Discharge status: Home / Self Care | Payer: Medicare Other | Source: Ambulatory Visit | Attending: Ophthalmology | Admitting: Ophthalmology

## 2018-04-12 ENCOUNTER — Encounter
Admission: RE | Disposition: A | Discharge status: Home / Self Care | Payer: Self-pay | Source: Ambulatory Visit | Attending: Ophthalmology

## 2018-04-12 ENCOUNTER — Encounter: Payer: Self-pay | Admitting: *Deleted

## 2018-04-12 DIAGNOSIS — Z7982 Long term (current) use of aspirin: Secondary | ICD-10-CM | POA: Diagnosis not present

## 2018-04-12 DIAGNOSIS — Z853 Personal history of malignant neoplasm of breast: Secondary | ICD-10-CM | POA: Insufficient documentation

## 2018-04-12 DIAGNOSIS — Z87891 Personal history of nicotine dependence: Secondary | ICD-10-CM | POA: Insufficient documentation

## 2018-04-12 DIAGNOSIS — Z79899 Other long term (current) drug therapy: Secondary | ICD-10-CM | POA: Insufficient documentation

## 2018-04-12 DIAGNOSIS — Z96653 Presence of artificial knee joint, bilateral: Secondary | ICD-10-CM | POA: Insufficient documentation

## 2018-04-12 DIAGNOSIS — I10 Essential (primary) hypertension: Secondary | ICD-10-CM | POA: Diagnosis not present

## 2018-04-12 DIAGNOSIS — H2512 Age-related nuclear cataract, left eye: Secondary | ICD-10-CM | POA: Insufficient documentation

## 2018-04-12 HISTORY — PX: CATARACT EXTRACTION W/PHACO: SHX586

## 2018-04-12 SURGERY — PHACOEMULSIFICATION, CATARACT, WITH IOL INSERTION
Anesthesia: Monitor Anesthesia Care | Site: Eye | Laterality: Left

## 2018-04-12 MED ORDER — ONDANSETRON HCL 4 MG/2ML IJ SOLN
INTRAMUSCULAR | Status: DC | PRN
  Administered 2018-04-12: 4 mg via INTRAVENOUS

## 2018-04-12 MED ORDER — POVIDONE-IODINE 5 % OP SOLN
OPHTHALMIC | Status: AC
  Filled 2018-04-12: qty 30

## 2018-04-12 MED ORDER — TETRACAINE HCL 0.5 % OP SOLN
OPHTHALMIC | Status: AC
  Administered 2018-04-12 (×3)
  Filled 2018-04-12: qty 4

## 2018-04-12 MED ORDER — ARMC OPHTHALMIC DILATING DROPS
OPHTHALMIC | Status: AC
  Administered 2018-04-12: 1 via OPHTHALMIC
  Filled 2018-04-12: qty 0.5

## 2018-04-12 MED ORDER — MOXIFLOXACIN HCL 0.5 % OP SOLN: 1.0000 [drp] | OPHTHALMIC | Status: DC | PRN

## 2018-04-12 MED ORDER — LIDOCAINE HCL (PF) 4 % IJ SOLN
INTRAOCULAR | Status: DC | PRN
  Administered 2018-04-12: 2 mL via OPHTHALMIC

## 2018-04-12 MED ORDER — LIDOCAINE HCL (PF) 4 % IJ SOLN
INTRAMUSCULAR | Status: AC
  Filled 2018-04-12: qty 5

## 2018-04-12 MED ORDER — ARMC OPHTHALMIC DILATING DROPS
1.0000 "application " | OPHTHALMIC | Status: AC
  Administered 2018-04-12: 1 via OPHTHALMIC

## 2018-04-12 MED ORDER — EPINEPHRINE PF 1 MG/ML IJ SOLN
INTRAOCULAR | Status: DC | PRN
  Administered 2018-04-12: 1 mL via OPHTHALMIC

## 2018-04-12 MED ORDER — MOXIFLOXACIN HCL 0.5 % OP SOLN
OPHTHALMIC | Status: AC
  Filled 2018-04-12: qty 3

## 2018-04-12 MED ORDER — NA CHONDROIT SULF-NA HYALURON 40-17 MG/ML IO SOLN
INTRAOCULAR | Status: AC
  Filled 2018-04-12: qty 1

## 2018-04-12 MED ORDER — SODIUM CHLORIDE 0.9 % IV SOLN
INTRAVENOUS | Status: DC
  Administered 2018-04-12: 09:00:00 via INTRAVENOUS

## 2018-04-12 MED ORDER — EPINEPHRINE 30 MG/30ML IJ SOLN
INTRAMUSCULAR | Status: AC
  Filled 2018-04-12: qty 1

## 2018-04-12 MED ORDER — ONDANSETRON HCL 4 MG/2ML IJ SOLN
INTRAMUSCULAR | Status: AC
  Filled 2018-04-12: qty 2

## 2018-04-12 MED ORDER — EPINEPHRINE PF 1 MG/ML IJ SOLN
INTRAMUSCULAR | Status: AC
  Filled 2018-04-12: qty 1

## 2018-04-12 MED ORDER — POVIDONE-IODINE 5 % OP SOLN
OPHTHALMIC | Status: DC | PRN
  Administered 2018-04-12: 1 via OPHTHALMIC

## 2018-04-12 MED ORDER — NA CHONDROIT SULF-NA HYALURON 40-17 MG/ML IO SOLN
INTRAOCULAR | Status: DC | PRN
  Administered 2018-04-12: 1 mL via INTRAOCULAR

## 2018-04-12 MED ORDER — CARBACHOL 0.01 % IO SOLN
INTRAOCULAR | Status: DC | PRN
  Administered 2018-04-12: .5 mL via INTRAOCULAR

## 2018-04-12 MED ORDER — MOXIFLOXACIN HCL 0.5 % OP SOLN
OPHTHALMIC | Status: DC | PRN
  Administered 2018-04-12: .2 mL via OPHTHALMIC

## 2018-04-12 SURGICAL SUPPLY — 16 items
GLOVE BIO SURGEON STRL SZ8 (GLOVE) ×3 IMPLANT
GLOVE BIOGEL M 6.5 STRL (GLOVE) ×3 IMPLANT
GLOVE SURG LX 8.0 MICRO (GLOVE) ×2
GLOVE SURG LX STRL 8.0 MICRO (GLOVE) ×1 IMPLANT
GOWN STRL REUS W/ TWL LRG LVL3 (GOWN DISPOSABLE) ×2 IMPLANT
GOWN STRL REUS W/TWL LRG LVL3 (GOWN DISPOSABLE) ×4
LABEL CATARACT MEDS ST (LABEL) ×3 IMPLANT
LENS IOL TECNIS ITEC 21.5 (Intraocular Lens) ×2 IMPLANT
PACK CATARACT (MISCELLANEOUS) ×3 IMPLANT
PACK CATARACT BRASINGTON LX (MISCELLANEOUS) ×3 IMPLANT
PACK EYE AFTER SURG (MISCELLANEOUS) ×3 IMPLANT
SOL BSS BAG (MISCELLANEOUS) ×3
SOLUTION BSS BAG (MISCELLANEOUS) ×1 IMPLANT
SYR 5ML LL (SYRINGE) ×3 IMPLANT
WATER STERILE IRR 250ML POUR (IV SOLUTION) ×3 IMPLANT
WIPE NON LINTING 3.25X3.25 (MISCELLANEOUS) ×3 IMPLANT

## 2018-04-12 NOTE — H&P (Signed)
All labs reviewed. Abnormal studies sent to patients PCP when indicated.  Previous H&P reviewed, patient examined, there are NO CHANGES.  Melinda Trier Porfilio10/1/20199:54 AM

## 2018-04-12 NOTE — Anesthesia Preprocedure Evaluation (Signed)
Anesthesia Evaluation  Patient identified by MRN, date of birth, ID band Patient awake    Reviewed: Allergy & Precautions, NPO status , Patient's Chart, lab work & pertinent test results, reviewed documented beta blocker date and time   Airway Mallampati: II  TM Distance: >3 FB     Dental  (+) Upper Dentures, Lower Dentures   Pulmonary shortness of breath, former smoker,           Cardiovascular hypertension, Pt. on medications + Valvular Problems/Murmurs      Neuro/Psych    GI/Hepatic   Endo/Other    Renal/GU      Musculoskeletal   Abdominal   Peds  Hematology  (+) anemia ,   Anesthesia Other Findings   Reproductive/Obstetrics                            Anesthesia Physical Anesthesia Plan  ASA: III  Anesthesia Plan: MAC   Post-op Pain Management:    Induction:   PONV Risk Score and Plan:   Airway Management Planned:   Additional Equipment:   Intra-op Plan:   Post-operative Plan:   Informed Consent: I have reviewed the patients History and Physical, chart, labs and discussed the procedure including the risks, benefits and alternatives for the proposed anesthesia with the patient or authorized representative who has indicated his/her understanding and acceptance.     Plan Discussed with: CRNA  Anesthesia Plan Comments:         Anesthesia Quick Evaluation  

## 2018-04-12 NOTE — Anesthesia Procedure Notes (Signed)
Date/Time: 04/12/2018 10:00 AM Performed by: Doreen Salvage, CRNA Pre-anesthesia Checklist: Patient identified, Emergency Drugs available, Suction available and Patient being monitored Patient Re-evaluated:Patient Re-evaluated prior to induction Oxygen Delivery Method: Nasal cannula Induction Type: IV induction Dental Injury: Teeth and Oropharynx as per pre-operative assessment  Comments: Nasal cannula with etCO2 monitoring

## 2018-04-12 NOTE — Op Note (Signed)
PREOPERATIVE DIAGNOSIS:  Nuclear sclerotic cataract of the left eye.   POSTOPERATIVE DIAGNOSIS:  Nuclear sclerotic cataract of the left eye.   OPERATIVE PROCEDURE: Procedure(s): CATARACT EXTRACTION PHACO AND INTRAOCULAR LENS PLACEMENT (IOC)   SURGEON:  Birder Robson, MD.   ANESTHESIA:  Anesthesiologist: Piscitello, Precious Haws, MD CRNA: Doreen Salvage, CRNA  1.      Managed anesthesia care. 2.     0.57ml of Shugarcaine was instilled following the paracentesis   COMPLICATIONS:  None.   TECHNIQUE:   Stop and chop   DESCRIPTION OF PROCEDURE:  The patient was examined and consented in the preoperative holding area where the aforementioned topical anesthesia was applied to the left eye and then brought back to the Operating Room where the left eye was prepped and draped in the usual sterile ophthalmic fashion and a lid speculum was placed. A paracentesis was created with the side port blade and the anterior chamber was filled with viscoelastic. A near clear corneal incision was performed with the steel keratome. A continuous curvilinear capsulorrhexis was performed with a cystotome followed by the capsulorrhexis forceps. Hydrodissection and hydrodelineation were carried out with BSS on a blunt cannula. The lens was removed in a stop and chop  technique and the remaining cortical material was removed with the irrigation-aspiration handpiece. The capsular bag was inflated with viscoelastic and the Technis ZCB00 lens was placed in the capsular bag without complication. The remaining viscoelastic was removed from the eye with the irrigation-aspiration handpiece. The wounds were hydrated. The anterior chamber was flushed with Miostat and the eye was inflated to physiologic pressure. 0.62ml Vigamox was placed in the anterior chamber. The wounds were found to be water tight. The eye was dressed with Vigamox. The patient was given protective glasses to wear throughout the day and a shield with which to sleep  tonight. The patient was also given drops with which to begin a drop regimen today and will follow-up with me in one day. Implant Name Type Inv. Item Serial No. Manufacturer Lot No. LRB No. Used  LENS IOL DIOP 21.5 - [id number] Intraocular Lens LENS IOL DIOP 21.5 [id number]  Left 1    Procedure(s) with comments: CATARACT EXTRACTION PHACO AND INTRAOCULAR LENS PLACEMENT (IOC) (Left) - [place]   01:18 AP%  CDE 16.41 Fluid pack lot # [id number]  Electronically signed: Birder Robson 04/12/2018 10:20 AM

## 2018-04-12 NOTE — Transfer of Care (Signed)
Immediate Anesthesia Transfer of Care Note  Patient: Dana Debo  Procedure(s) Performed: Procedure(s) with comments: CATARACT EXTRACTION PHACO AND INTRAOCULAR LENS PLACEMENT (IOC) (Left) - [place]   01:18 AP%  CDE 16.41 Fluid pack lot # [id number]  Patient Location: PHASE II  Anesthesia Type:MAC  Level of Consciousness: Awake, Alert, Oriented  Airway & Oxygen Therapy: Patient Spontanous Breathing and Patient on room air   Post-op Assessment: Report given to RN and Post -op Vital signs reviewed and stable  Post vital signs: Reviewed and stable  Last Vitals:  Vitals:   04/12/18 0902 04/12/18 1021  BP: (!) 142/60 113/63  Pulse: 81 91  Resp: 16 16  Temp: 36.9 C 36.5 C  SpO2: 627% 035%    Complications: No apparent anesthesia complications

## 2018-04-12 NOTE — Anesthesia Postprocedure Evaluation (Signed)
Anesthesia Post Note  Patient: Melinda Little  Procedure(s) Performed: CATARACT EXTRACTION PHACO AND INTRAOCULAR LENS PLACEMENT ([place]) (Left Eye)  Patient location during evaluation: PACU Anesthesia Type: MAC Level of consciousness: awake and alert Pain management: pain level controlled Vital Signs Assessment: post-procedure vital signs reviewed and stable Respiratory status: spontaneous breathing, nonlabored ventilation, respiratory function stable and patient connected to nasal cannula oxygen Cardiovascular status: stable and blood pressure returned to baseline Postop Assessment: no apparent nausea or vomiting Anesthetic complications: no     Last Vitals:  Vitals:   04/12/18 0902 04/12/18 1021  BP: (!) 142/60 113/63  Pulse: 81 91  Resp: 16 16  Temp: 36.9 C 36.5 C  SpO2: 100% 100%    Last Pain:  Vitals:   04/12/18 1021  TempSrc: Temporal  PainSc: 0-No pain                 Doreen Salvage A

## 2018-04-12 NOTE — Discharge Instructions (Signed)
Eye Surgery Discharge Instructions    Expect mild scratchy sensation or mild soreness. DO NOT RUB YOUR EYE!  The day of surgery:  Minimal physical activity, but bed rest is not required  No reading, computer work, or close hand work  No bending, lifting, or straining.  May watch TV  For 24 hours:  No driving, legal decisions, or alcoholic beverages  Safety precautions  Eat anything you prefer: It is better to start with liquids, then soup then solid foods.  _____ Eye patch should be worn until postoperative exam tomorrow.  ____ Solar shield eyeglasses should be worn for comfort in the sunlight/patch while sleeping  Resume all regular medications including aspirin or Coumadin if these were discontinued prior to surgery. You may shower, bathe, shave, or wash your hair. Tylenol may be taken for mild discomfort.  Call your doctor if you experience significant pain, nausea, or vomiting, fever > 101 or other signs of infection. [phone] or [phone] Specific instructions:  Follow-up Information    Birder Robson, MD Follow up.   Specialty:  Ophthalmology Why:  04/13/18 at10:25 Contact information: [address] [place] [address] [phone]          Eye Surgery Discharge Instructions    Expect mild scratchy sensation or mild soreness. DO NOT RUB YOUR EYE!  The day of surgery:  Minimal physical activity, but bed rest is not required  No reading, computer work, or close hand work  No bending, lifting, or straining.  May watch TV  For 24 hours:  No driving, legal decisions, or alcoholic beverages  Safety precautions  Eat anything you prefer: It is better to start with liquids, then soup then solid foods.  _____ Eye patch should be worn until postoperative exam tomorrow.  ____ Solar shield eyeglasses should be worn for comfort in the sunlight/patch while sleeping  Resume all regular medications including aspirin or Coumadin if  these were discontinued prior to surgery. You may shower, bathe, shave, or wash your hair. Tylenol may be taken for mild discomfort.  Call your doctor if you experience significant pain, nausea, or vomiting, fever > 101 or other signs of infection. [phone] or [phone] Specific instructions:  Follow-up Information    Birder Robson, MD Follow up.   Specialty:  Ophthalmology Why:  04/13/18 at10:25 Contact information: [place] [address] [phone]

## 2018-04-12 NOTE — Anesthesia Post-op Follow-up Note (Signed)
Anesthesia QCDR form completed.        

## 2018-04-13 ENCOUNTER — Encounter: Payer: Self-pay | Admitting: Ophthalmology

## 2018-04-19 IMAGING — DX DG KNEE 1-2V PORT*R*
2 series · 2 of 2 positions shown · non-contrast
Comparison: None.

CLINICAL DATA: Postop

EXAM:
PORTABLE RIGHT KNEE - 1-2 VIEW

[knee ap]
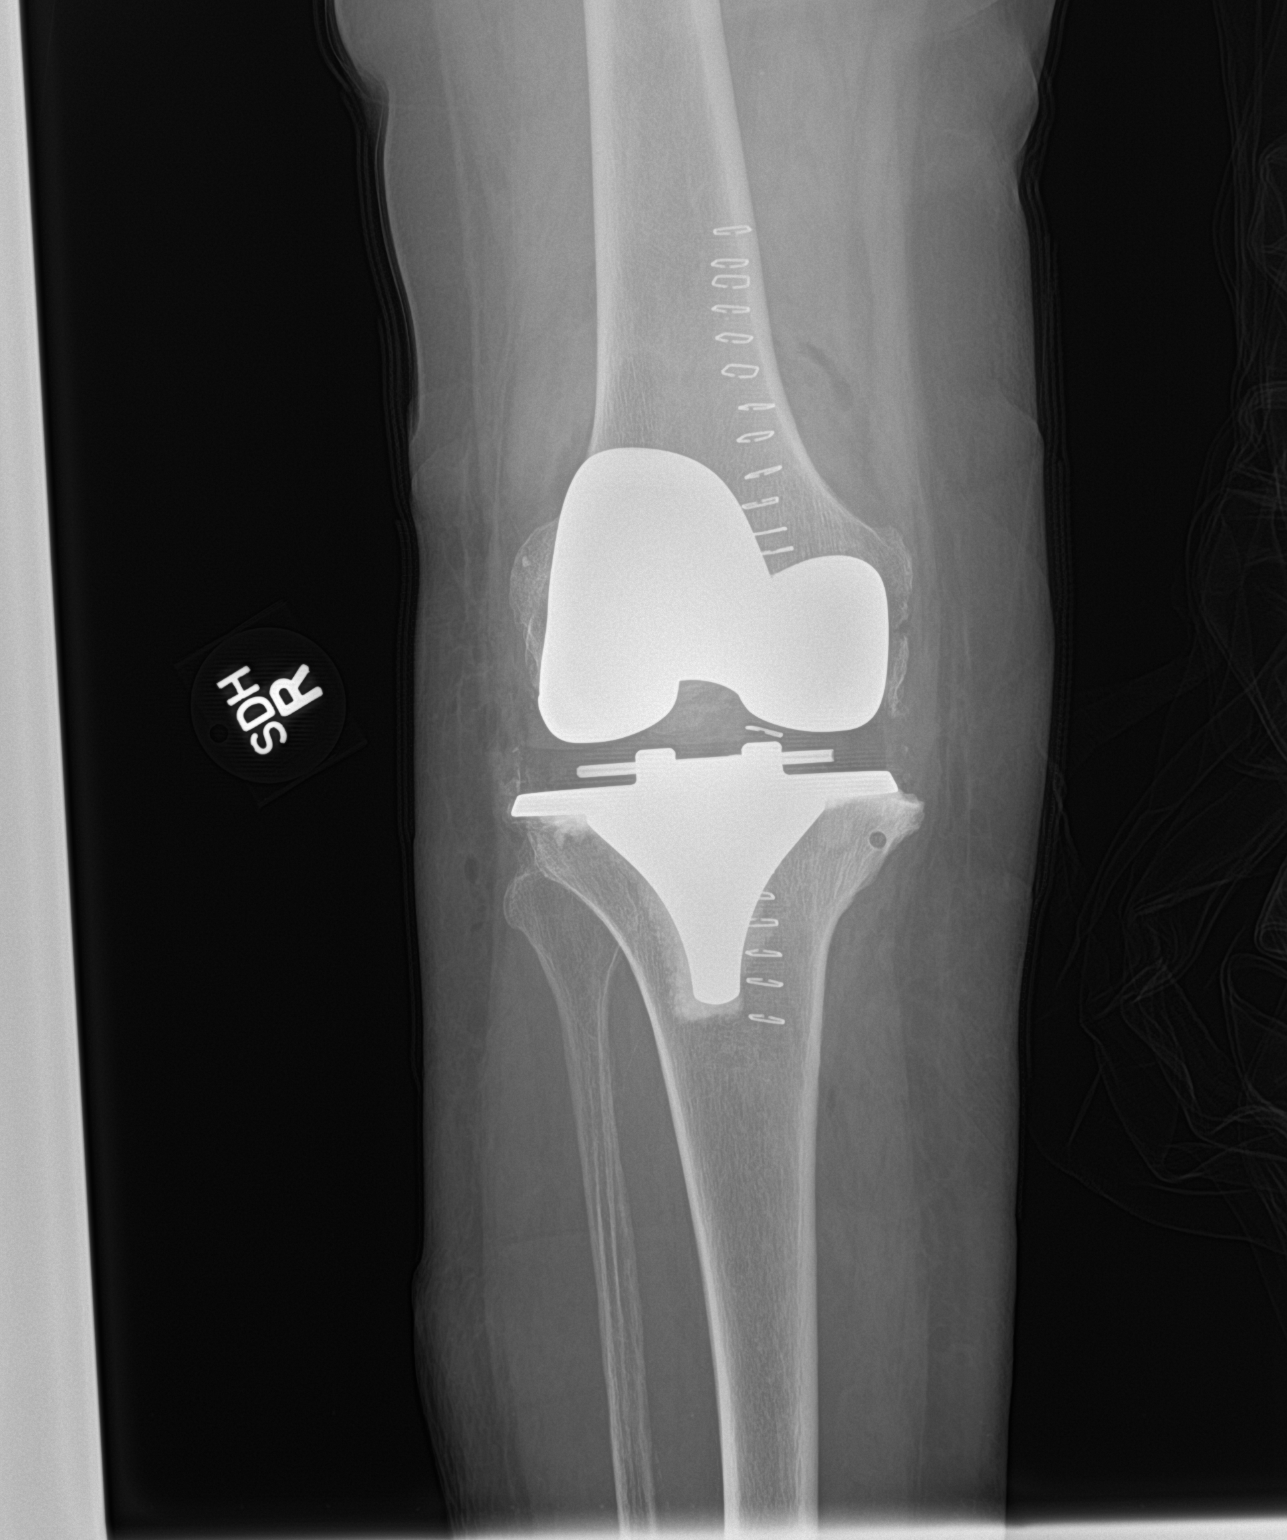

[knee lat]
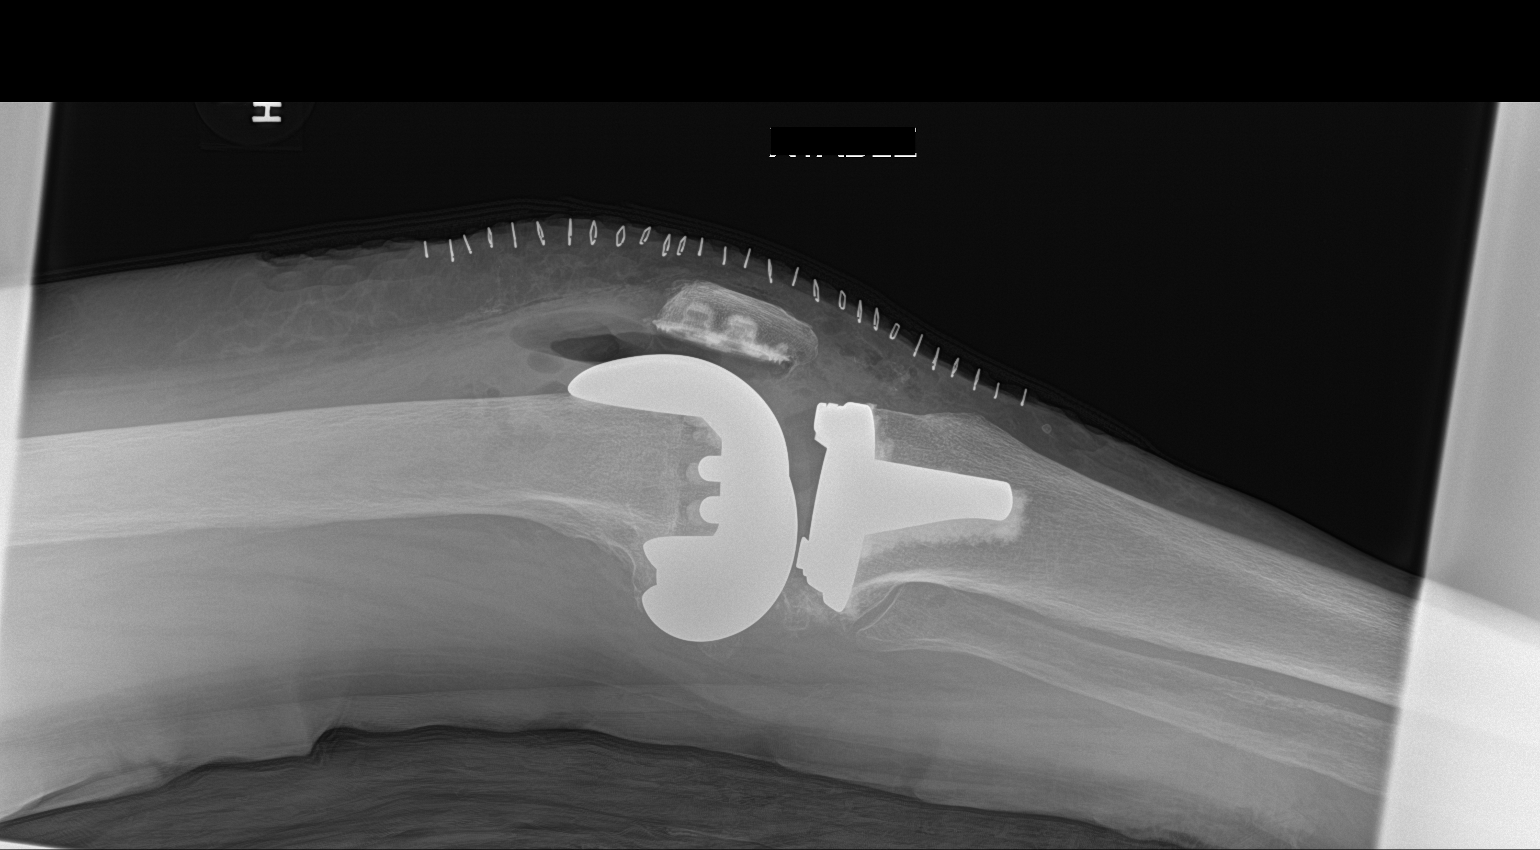

[2 of 2 positions shown; findings below may reference images not displayed]

FINDINGS: Total knee arthroplasty is in place. There is anatomic alignment of
the osseous and prostatic structures. There is no breakage or
loosening of the hardware.
IMPRESSION: Total knee arthroplasty anatomically aligned.

## 2018-06-02 ENCOUNTER — Other Ambulatory Visit: Payer: Self-pay

## 2018-06-02 ENCOUNTER — Encounter: Payer: Self-pay | Admitting: Emergency Medicine

## 2018-06-02 ENCOUNTER — Emergency Department: Payer: Medicare Other

## 2018-06-02 DIAGNOSIS — D649 Anemia, unspecified: Secondary | ICD-10-CM | POA: Diagnosis present

## 2018-06-02 DIAGNOSIS — I350 Nonrheumatic aortic (valve) stenosis: Secondary | ICD-10-CM | POA: Diagnosis present

## 2018-06-02 DIAGNOSIS — E440 Moderate protein-calorie malnutrition: Secondary | ICD-10-CM | POA: Diagnosis present

## 2018-06-02 DIAGNOSIS — I214 Non-ST elevation (NSTEMI) myocardial infarction: Secondary | ICD-10-CM | POA: Diagnosis present

## 2018-06-02 DIAGNOSIS — R570 Cardiogenic shock: Secondary | ICD-10-CM | POA: Diagnosis not present

## 2018-06-02 DIAGNOSIS — I2510 Atherosclerotic heart disease of native coronary artery without angina pectoris: Secondary | ICD-10-CM | POA: Diagnosis present

## 2018-06-02 DIAGNOSIS — J9601 Acute respiratory failure with hypoxia: Secondary | ICD-10-CM | POA: Diagnosis not present

## 2018-06-02 DIAGNOSIS — R0902 Hypoxemia: Secondary | ICD-10-CM

## 2018-06-02 DIAGNOSIS — Z96651 Presence of right artificial knee joint: Secondary | ICD-10-CM | POA: Diagnosis present

## 2018-06-02 DIAGNOSIS — R6521 Severe sepsis with septic shock: Secondary | ICD-10-CM | POA: Diagnosis not present

## 2018-06-02 DIAGNOSIS — R34 Anuria and oliguria: Secondary | ICD-10-CM | POA: Diagnosis present

## 2018-06-02 DIAGNOSIS — R7989 Other specified abnormal findings of blood chemistry: Secondary | ICD-10-CM

## 2018-06-02 DIAGNOSIS — Z7982 Long term (current) use of aspirin: Secondary | ICD-10-CM

## 2018-06-02 DIAGNOSIS — Z4659 Encounter for fitting and adjustment of other gastrointestinal appliance and device: Secondary | ICD-10-CM | POA: Diagnosis not present

## 2018-06-02 DIAGNOSIS — E872 Acidosis: Secondary | ICD-10-CM | POA: Diagnosis present

## 2018-06-02 DIAGNOSIS — Z79899 Other long term (current) drug therapy: Secondary | ICD-10-CM

## 2018-06-02 DIAGNOSIS — Z6821 Body mass index (BMI) 21.0-21.9, adult: Secondary | ICD-10-CM

## 2018-06-02 DIAGNOSIS — E782 Mixed hyperlipidemia: Secondary | ICD-10-CM | POA: Diagnosis present

## 2018-06-02 DIAGNOSIS — E871 Hypo-osmolality and hyponatremia: Secondary | ICD-10-CM | POA: Diagnosis present

## 2018-06-02 DIAGNOSIS — Z515 Encounter for palliative care: Secondary | ICD-10-CM | POA: Diagnosis present

## 2018-06-02 DIAGNOSIS — I110 Hypertensive heart disease with heart failure: Secondary | ICD-10-CM | POA: Diagnosis present

## 2018-06-02 DIAGNOSIS — Z853 Personal history of malignant neoplasm of breast: Secondary | ICD-10-CM

## 2018-06-02 DIAGNOSIS — Z978 Presence of other specified devices: Secondary | ICD-10-CM | POA: Diagnosis not present

## 2018-06-02 DIAGNOSIS — R7303 Prediabetes: Secondary | ICD-10-CM | POA: Diagnosis present

## 2018-06-02 DIAGNOSIS — R0602 Shortness of breath: Secondary | ICD-10-CM

## 2018-06-02 DIAGNOSIS — Z87891 Personal history of nicotine dependence: Secondary | ICD-10-CM | POA: Diagnosis not present

## 2018-06-02 DIAGNOSIS — I509 Heart failure, unspecified: Secondary | ICD-10-CM

## 2018-06-02 DIAGNOSIS — A419 Sepsis, unspecified organism: Secondary | ICD-10-CM | POA: Diagnosis not present

## 2018-06-02 DIAGNOSIS — Z90711 Acquired absence of uterus with remaining cervical stump: Secondary | ICD-10-CM

## 2018-06-02 DIAGNOSIS — R339 Retention of urine, unspecified: Secondary | ICD-10-CM | POA: Diagnosis not present

## 2018-06-02 DIAGNOSIS — Z9889 Other specified postprocedural states: Secondary | ICD-10-CM

## 2018-06-02 DIAGNOSIS — I5040 Unspecified combined systolic (congestive) and diastolic (congestive) heart failure: Secondary | ICD-10-CM | POA: Diagnosis not present

## 2018-06-02 DIAGNOSIS — R778 Other specified abnormalities of plasma proteins: Secondary | ICD-10-CM

## 2018-06-02 DIAGNOSIS — J810 Acute pulmonary edema: Secondary | ICD-10-CM

## 2018-06-02 DIAGNOSIS — R0600 Dyspnea, unspecified: Secondary | ICD-10-CM | POA: Diagnosis not present

## 2018-06-02 DIAGNOSIS — J90 Pleural effusion, not elsewhere classified: Secondary | ICD-10-CM

## 2018-06-02 DIAGNOSIS — I5021 Acute systolic (congestive) heart failure: Secondary | ICD-10-CM | POA: Diagnosis present

## 2018-06-02 DIAGNOSIS — Z66 Do not resuscitate: Secondary | ICD-10-CM | POA: Diagnosis present

## 2018-06-02 DIAGNOSIS — Z881 Allergy status to other antibiotic agents status: Secondary | ICD-10-CM

## 2018-06-02 LAB — PROTIME-INR
INR: 0.9
Prothrombin Time: 12.1 seconds (ref 11.4–15.2)

## 2018-06-02 LAB — CBC WITH DIFFERENTIAL/PLATELET
Abs Immature Granulocytes: 0.07 10*3/uL (ref 0.00–0.07)
BASOS PCT: 0 %
Basophils Absolute: 0 10*3/uL (ref 0.0–0.1)
EOS ABS: 0 10*3/uL (ref 0.0–0.5)
Eosinophils Relative: 0 %
HCT: 37.1 % (ref 36.0–46.0)
Hemoglobin: 12.4 g/dL (ref 12.0–15.0)
Immature Granulocytes: 1 %
Lymphocytes Relative: 5 %
Lymphs Abs: 0.7 10*3/uL (ref 0.7–4.0)
MCH: 30.7 pg (ref 26.0–34.0)
MCHC: 33.4 g/dL (ref 30.0–36.0)
MCV: 91.8 fL (ref 80.0–100.0)
MONO ABS: 1.1 10*3/uL — AB (ref 0.1–1.0)
MONOS PCT: 8 %
Neutro Abs: 11.3 10*3/uL — ABNORMAL HIGH (ref 1.7–7.7)
Neutrophils Relative %: 86 %
PLATELETS: 264 10*3/uL (ref 150–400)
RBC: 4.04 MIL/uL (ref 3.87–5.11)
RDW: 13.1 % (ref 11.5–15.5)
WBC: 13.1 10*3/uL — ABNORMAL HIGH (ref 4.0–10.5)
nRBC: 0 % (ref 0.0–0.2)

## 2018-06-02 LAB — BASIC METABOLIC PANEL
Anion gap: 12 (ref 5–15)
BUN: 23 mg/dL (ref 8–23)
CALCIUM: 9 mg/dL (ref 8.9–10.3)
CO2: 17 mmol/L — ABNORMAL LOW (ref 22–32)
Chloride: 95 mmol/L — ABNORMAL LOW (ref 98–111)
Creatinine, Ser: 0.7 mg/dL (ref 0.44–1.00)
GFR calc Af Amer: >60 mL/min (ref >60)
GLUCOSE: 292 mg/dL — AB (ref 70–99)
POTASSIUM: 4.1 mmol/L (ref 3.5–5.1)
Sodium: 124 mmol/L — ABNORMAL LOW (ref 135–145)

## 2018-06-02 LAB — LACTIC ACID, PLASMA: Lactic Acid, Venous: 1.2 mmol/L (ref 0.5–1.9)

## 2018-06-02 LAB — MRSA PCR SCREENING: MRSA BY PCR: NEGATIVE

## 2018-06-02 LAB — TSH: TSH: 1.838 u[IU]/mL (ref 0.350–4.500)

## 2018-06-02 LAB — GLUCOSE, CAPILLARY: Glucose-Capillary: 126 mg/dL — ABNORMAL HIGH (ref 70–99)

## 2018-06-02 LAB — TROPONIN I
TROPONIN I: 20.09 ng/mL — AB (ref <0.03)
Troponin I: 5.75 ng/mL (ref <0.03)

## 2018-06-02 LAB — APTT: aPTT: 26 seconds (ref 24–36)

## 2018-06-02 LAB — BRAIN NATRIURETIC PEPTIDE: B NATRIURETIC PEPTIDE 5: 3460 pg/mL — AB (ref 0.0–100.0)

## 2018-06-02 MED ORDER — ACETAMINOPHEN 325 MG PO TABS: 650.0000 mg | ORAL_TABLET | Freq: Four times a day (QID) | ORAL | Status: DC | PRN

## 2018-06-02 MED ORDER — HEPARIN BOLUS VIA INFUSION
3400.0000 [IU] | Freq: Once | INTRAVENOUS | Status: AC
  Administered 2018-06-02: 3400 [IU] via INTRAVENOUS
  Filled 2018-06-02: qty 3400

## 2018-06-02 MED ORDER — ONDANSETRON HCL 4 MG/2ML IJ SOLN: 4.0000 mg | Freq: Four times a day (QID) | INTRAMUSCULAR | Status: DC | PRN

## 2018-06-02 MED ORDER — POLYETHYLENE GLYCOL 3350 17 G PO PACK: 17.0000 g | PACK | Freq: Every day | ORAL | Status: DC | PRN

## 2018-06-02 MED ORDER — INSULIN ASPART 100 UNIT/ML ~~LOC~~ SOLN
0.0000 [IU] | Freq: Three times a day (TID) | SUBCUTANEOUS | Status: DC
  Administered 2018-06-04: 1 [IU] via SUBCUTANEOUS
  Administered 2018-06-04: 2 [IU] via SUBCUTANEOUS
  Filled 2018-06-02 (×2): qty 1

## 2018-06-02 MED ORDER — HYDROCODONE-ACETAMINOPHEN 5-325 MG PO TABS: 1.0000 | ORAL_TABLET | ORAL | Status: DC | PRN

## 2018-06-02 MED ORDER — SODIUM CHLORIDE 0.9 % IV SOLN: 250.0000 mL | INTRAVENOUS | Status: DC | PRN

## 2018-06-02 MED ORDER — FUROSEMIDE 10 MG/ML IJ SOLN
20.0000 mg | Freq: Two times a day (BID) | INTRAMUSCULAR | Status: DC
  Administered 2018-06-03 – 2018-06-04 (×3): 20 mg via INTRAVENOUS
  Filled 2018-06-02 (×3): qty 2

## 2018-06-02 MED ORDER — SODIUM CHLORIDE 0.9% FLUSH
3.0000 mL | INTRAVENOUS | Status: DC | PRN
  Administered 2018-06-02: 3 mL via INTRAVENOUS
  Filled 2018-06-02: qty 3

## 2018-06-02 MED ORDER — ACETAMINOPHEN 650 MG RE SUPP: 650.0000 mg | Freq: Four times a day (QID) | RECTAL | Status: DC | PRN

## 2018-06-02 MED ORDER — FUROSEMIDE 10 MG/ML IJ SOLN
40.0000 mg | Freq: Once | INTRAMUSCULAR | Status: AC
  Administered 2018-06-02: 40 mg via INTRAVENOUS
  Filled 2018-06-02: qty 4

## 2018-06-02 MED ORDER — ASPIRIN 81 MG PO CHEW
324.0000 mg | CHEWABLE_TABLET | Freq: Once | ORAL | Status: AC
  Administered 2018-06-02: 324 mg via ORAL
  Filled 2018-06-02: qty 4

## 2018-06-02 MED ORDER — SODIUM CHLORIDE 0.9% FLUSH
3.0000 mL | Freq: Two times a day (BID) | INTRAVENOUS | Status: DC
  Administered 2018-06-02 – 2018-06-03 (×3): 3 mL via INTRAVENOUS

## 2018-06-02 MED ORDER — LISINOPRIL 5 MG PO TABS: 5.0000 mg | ORAL_TABLET | Freq: Every day | ORAL | Status: DC

## 2018-06-02 MED ORDER — BISACODYL 5 MG PO TBEC: 5.0000 mg | DELAYED_RELEASE_TABLET | Freq: Every day | ORAL | Status: DC | PRN

## 2018-06-02 MED ORDER — HEPARIN (PORCINE) 25000 UT/250ML-% IV SOLN
800.0000 [IU]/h | INTRAVENOUS | Status: DC
  Administered 2018-06-02: 650 [IU]/h via INTRAVENOUS
  Filled 2018-06-02: qty 250

## 2018-06-02 MED ORDER — ASPIRIN 81 MG PO CHEW
81.0000 mg | CHEWABLE_TABLET | Freq: Every day | ORAL | Status: DC
  Administered 2018-06-03 – 2018-06-04 (×2): 81 mg via ORAL
  Filled 2018-06-02 (×2): qty 1

## 2018-06-02 MED ORDER — ONDANSETRON HCL 4 MG PO TABS: 4.0000 mg | ORAL_TABLET | Freq: Four times a day (QID) | ORAL | Status: DC | PRN

## 2018-06-02 NOTE — Consult Note (Signed)
ANTICOAGULATION CONSULT NOTE - Initial Consult  Pharmacy Consult for Heparin Indication: chest pain/ACS  Allergies  Allergen Reactions  . Ciprofloxacin Other (See Comments)    Hallucinations      Patient Measurements: Height: 5\' 4"  (162.6 cm) Weight: 126 lb (57.2 kg) IBW/kg (Calculated) : 54.7 Heparin Dosing Weight: 57.2 kg  Vital Signs: Temp: 97.6 F (36.4 C) (11/21 1518) Temp Source: Oral (11/21 1518) BP: 127/86 (11/21 1615) Pulse Rate: 104 (11/21 1615)  Labs: Recent Labs    06/03/2018 1523  HGB 12.4  HCT 37.1  PLT 264  CREATININE 0.70  TROPONINI 5.75*    Estimated Creatinine Clearance: 43.6 mL/min (by C-G formula based on SCr of 0.7 mg/dL).   Medical History: Past Medical History:  Diagnosis Date  . Anemia   . Breast cancer ([place]) 2012   right breast/mammosite/radiation  . Breast cancer, right [facility]) 2012   right breast with lumpectomy and mammosite rad tx  . Dyspnea    DOE  . Heart murmur   . Hypertension   . Pain    BACK/ RECENT STEROID INJECTION  . Pre-diabetes   . Tremor    HEAD    Medications:  Scheduled:  . heparin  3,400 Units Intravenous Once    Assessment: 84 yo female presented with complaints of worsening SOB x 2 weeks. Not on any PTA anticoagulants.  Goal of Therapy:  Heparin level 0.3-0.7 units/ml Monitor platelets by anticoagulation protocol: Yes   Plan:  Ordered heparin bolus of 3400 units, followed by 650 units continuous infusion. Will check heparin level at 0100 on 11/22.  Pharmacy will continue to monitor.   Paticia Stack, PharmD Pharmacy Resident  06/07/2018 4:29 PM

## 2018-06-02 NOTE — ED Notes (Signed)
Date and time results received: 05/23/2018 1607 (use smartphrase ".now" to insert current time)  Test: Troponin  Critical Value: 5.75  Name of Provider Notified: MD Archie Balboa   Orders Received? Or Actions Taken?: No orders received

## 2018-06-02 NOTE — Consult Note (Signed)
[facility] Consultation Note  Patient ID: Melinda Little, MRN: [id number], DOB/AGE: 01-02-1930 84 y.o. Admit date: 05/26/2018   Date of Consult: 05/13/2018 Primary Physician: Dion Body, MD Primary Cardiologist: None  Chief Complaint:  Chief Complaint  Patient presents with  . Shortness of Breath   Reason for Consult: Heart attack  HPI: 84 y.o. female with essential hypertension mixed hyperlipidemia with new onset symptoms of weakness fatigue chest pressure.  This lasted throughout the morning and afternoon of today and then she did not feel well enough to do anything else.  She was brought to the emergency room where she had an EKG showing normal sinus rhythm left axis deviation left atrial enlargement with left ventricular hypertrophy and preventricular contractions.  Troponin was drawn which was 5.75 consistent with myocardial infarction.  She has been placed on appropriate medication management including heparin oxygen with some chest x-ray findings of pulmonary edema.  Patient has had significant improvements at this time and is feeling much better.  He is hemodynamically stable  Past Medical History:  Diagnosis Date  . Anemia   . Breast cancer ([place]) 2012   right breast/mammosite/radiation  . Breast cancer, right [facility]) 2012   right breast with lumpectomy and mammosite rad tx  . Dyspnea    DOE  . Heart murmur   . Hypertension   . Pain    BACK/ RECENT STEROID INJECTION  . Pre-diabetes   . Tremor    HEAD      Surgical History:  Past Surgical History:  Procedure Laterality Date  . ABDOMINAL HYSTERECTOMY    . BREAST BIOPSY Right 2012   positve  . BREAST BIOPSY Right 2012   negative  . BREAST LUMPECTOMY    . CATARACT EXTRACTION W/PHACO Right 03/22/2018   Procedure: CATARACT EXTRACTION PHACO AND INTRAOCULAR LENS PLACEMENT (IOC);  Surgeon: Birder Robson, MD;  Location: [facility] ORS;  Service: Ophthalmology;  Laterality: Right;  [place] 01:18 AP%  19.0 CDE 14.95 Fluid pack lot # [id number]  . CATARACT EXTRACTION W/PHACO Left 04/12/2018   Procedure: CATARACT EXTRACTION PHACO AND INTRAOCULAR LENS PLACEMENT (IOC);  Surgeon: Birder Robson, MD;  Location: [facility] ORS;  Service: Ophthalmology;  Laterality: Left;  [place]   01:18 AP%  CDE 16.41 Fluid pack lot # [id number]  . COLON SURGERY    . JOINT REPLACEMENT     left knee 2005  . PARTIAL HYSTERECTOMY  1978  . TOTAL KNEE ARTHROPLASTY Right 05/19/2016   Procedure: TOTAL KNEE ARTHROPLASTY;  Surgeon: Corky Mull, MD;  Location: [facility] ORS;  Service: Orthopedics;  Laterality: Right;     Home Meds: Prior to Admission medications   Medication Sig Start Date End Date Taking? Authorizing Provider  aspirin EC 81 MG tablet Take 81 mg by mouth daily.    Yes [provider]  lisinopril (PRINIVIL,ZESTRIL) 10 MG tablet Take 10 mg by mouth daily with breakfast.  02/11/15  Yes [provider]  traMADol (ULTRAM) 50 MG tablet Take 50 mg by mouth every 8 (eight) hours as needed for moderate pain. 02/22/18  Yes [provider]    Inpatient Medications:  . insulin aspart  0-9 Units Subcutaneous TID WC  . lisinopril  5 mg Oral Daily   . heparin 650 Units/hr (06/01/2018 1702)    Allergies:  Allergies  Allergen Reactions  . Ciprofloxacin Other (See Comments)    Hallucinations      Social History   Socioeconomic History  . Marital status: Married    Spouse name:  Not on file  . Number of children: Not on file  . Years of education: Not on file  . Highest education level: Not on file  Occupational History  . Not on file  Social Needs  . Financial resource strain: Not on file  . Food insecurity:    Worry: Not on file    Inability: Not on file  . Transportation needs:    Medical: Not on file    Non-medical: Not on file  Tobacco Use  . Smoking status: Former Research scientist (life sciences)  . Smokeless tobacco: Never Used  Substance and Sexual Activity  . Alcohol use: No  . Drug use: No  .  Sexual activity: Not on file  Lifestyle  . Physical activity:    Days per week: Not on file    Minutes per session: Not on file  . Stress: Not on file  Relationships  . Social connections:    Talks on phone: Not on file    Gets together: Not on file    Attends religious service: Not on file    Active member of club or organization: Not on file    Attends meetings of clubs or organizations: Not on file    Relationship status: Not on file  . Intimate partner violence:    Fear of current or ex partner: Not on file    Emotionally abused: Not on file    Physically abused: Not on file    Forced sexual activity: Not on file  Other Topics Concern  . Not on file  Social History Narrative  . Not on file     No family history on file.   Review of Systems Positive for shortness of breath chest pressure pain Negative for: General:  chills, fever, night sweats or weight changes.  Cardiovascular: PND orthopnea syncope dizziness  Dermatological skin lesions rashes Respiratory: Cough congestion Urologic: Frequent urination urination at night and hematuria Abdominal: negative for nausea, vomiting, diarrhea, bright red blood per rectum, melena, or hematemesis Neurologic: negative for visual changes, and/or hearing changes  All other systems reviewed and are otherwise negative except as noted above.  Labs: Recent Labs    06/06/2018 1523  TROPONINI 5.75*   Lab Results  Component Value Date   WBC 13.1 (H) 05/22/2018   HGB 12.4 06/01/2018   HCT 37.1 05/23/2018   MCV 91.8 05/14/2018   PLT 264 06/11/2018    Recent Labs  Lab 05/17/2018 1523  NA 124*  K 4.1  CL 95*  CO2 17*  BUN 23  CREATININE 0.70  CALCIUM 9.0  GLUCOSE 292*   No results found for: CHOL, HDL, LDLCALC, TRIG No results found for: DDIMER  Radiology/Studies:  Dg Chest 2 View  Result Date: 06/06/2018 CLINICAL DATA:  Shortness of breath. EXAM: CHEST - 2 VIEW COMPARISON:  05/21/2016 FINDINGS: There is a moderate to  large left pleural effusion and a small to moderate right pleural effusion. Moderate diffuse pulmonary edema is identified bilaterally. Background chronic lung disease is identified, likely emphysema. The cardiac silhouette is obscured by left pleural effusion. IMPRESSION: 1. Suspect moderate congestive heart failure. 2.  Emphysema (ICD10-J43.9). Electronically Signed   By: Kerby Moors M.D.   On: 06/01/2018 16:12    EKG: Normal sinus rhythm left axis deviation left atrial enlargement left ventricular hypertrophy  Weights: Filed Weights   05/20/2018 1521  Weight: 57.2 kg     Physical Exam: Blood pressure (!) 102/50, pulse 91, temperature 97.6 F (36.4 C), temperature source Oral, resp.  rate (!) 31, height 5\' 4"  (1.626 m), weight 57.2 kg, SpO2 94 %. Body mass index is 21.63 kg/m. General: Well developed, well nourished, in no acute distress. Head eyes ears nose throat: Normocephalic, atraumatic, sclera non-icteric, no xanthomas, nares are without discharge. No apparent thyromegaly and/or mass  Lungs: Normal respiratory effort.  no wheezes, few basilar rales, no rhonchi.  Heart: RRR with normal S1 S2. no murmur gallop, no rub, PMI is normal size and placement, carotid upstroke normal without bruit, jugular venous pressure is normal Abdomen: Soft, non-tender, non-distended with normoactive bowel sounds. No hepatomegaly. No rebound/guarding. No obvious abdominal masses. Abdominal aorta is normal size without bruit Extremities: No edema. no cyanosis, no clubbing, no ulcers  Peripheral : 2+ bilateral upper extremity pulses, 2+ bilateral femoral pulses, 2+ bilateral dorsal pedal pulse Neuro: Alert and oriented. No facial asymmetry. No focal deficit. Moves all extremities spontaneously. Musculoskeletal: Normal muscle tone without kyphosis Psych:  Responds to questions appropriately with a normal affect.    Assessment: 84 year old female with acute non-ST elevation myocardial infarction with mild  congestive heart failure proving with appropriate medication management  Plan: 1.  Continue heparin for further risk reduction in myocardial infarction 2.  Oxygen for help with hypoxia congestive heart failure 3.  Lasix as necessary for pulmonary edema and hypoxia 4.  Avoid beta-blocker at this time until the patient has better blood pressure 5.  After medication management above would proceed to cardiac catheterization to assess coronary anatomy and further treatment thereof is necessary.  Patient understands risk and benefits of cardiac catheterization.  This includes a possibility of death stroke heart attack infection bleeding blood clot.  She is at low risk for conscious sedation  Signed, Corey Skains M.D. [facility] 05/26/2018, 6:35 PM

## 2018-06-02 NOTE — H&P (Addendum)
[facility] NAME: Melinda Little    MR#:  [id number]  DATE OF BIRTH:  [birth year]  DATE OF ADMISSION:  05/17/2018  PRIMARY CARE PHYSICIAN: Dion Body, MD   REQUESTING/REFERRING PHYSICIAN:dr goodman  CHIEF COMPLAINT:   Worsening SOB x 2 weeks HISTORY OF PRESENT ILLNESS:  Melinda Little  is a 84 y.o. female with a known history of breast cancer and mild AS who presents to the emergency room via EMS due to worsening shortness of breath for the past 2 weeks.  Patient felt like she could not catch her breath.  On arrival she was 85% on room air initially placed on 4 L and then nonrebreather and now on BiPAP.  Patient was noted to have a chest x-ray consistent with pulmonary edema and an elevated troponin.  She denies chest pain,however has lower extremity edema, PND and orthopnea.  ER physician spoke with Dr. Clayborn Bigness about the EKG that was performed in the emergency room.  The EKG read as possible acute STEMI.  According to the cardiologist it does not appear to be STEMI.  Patient has been started on heparin drip and given IV Lasix.  PAST MEDICAL HISTORY:   Past Medical History:  Diagnosis Date  . Anemia   . Breast cancer ([place]) 2012   right breast/mammosite/radiation  . Breast cancer, right [facility]) 2012   right breast with lumpectomy and mammosite rad tx  . Dyspnea    DOE  . Heart murmur   . Hypertension   . Pain    BACK/ RECENT STEROID INJECTION  . Pre-diabetes   . Tremor    HEAD    PAST SURGICAL HISTORY:   Past Surgical History:  Procedure Laterality Date  . ABDOMINAL HYSTERECTOMY    . BREAST BIOPSY Right 2012   positve  . BREAST BIOPSY Right 2012   negative  . BREAST LUMPECTOMY    . CATARACT EXTRACTION W/PHACO Right 03/22/2018   Procedure: CATARACT EXTRACTION PHACO AND INTRAOCULAR LENS PLACEMENT (IOC);  Surgeon: Birder Robson, MD;  Location: [facility] ORS;  Service: Ophthalmology;  Laterality: Right;  [place]  01:18 AP% 19.0 CDE 14.95 Fluid pack lot # [id number]  . CATARACT EXTRACTION W/PHACO Left 04/12/2018   Procedure: CATARACT EXTRACTION PHACO AND INTRAOCULAR LENS PLACEMENT (IOC);  Surgeon: Birder Robson, MD;  Location: [facility] ORS;  Service: Ophthalmology;  Laterality: Left;  [place]   01:18 AP%  CDE 16.41 Fluid pack lot # [id number]  . COLON SURGERY    . JOINT REPLACEMENT     left knee 2005  . PARTIAL HYSTERECTOMY  1978  . TOTAL KNEE ARTHROPLASTY Right 05/19/2016   Procedure: TOTAL KNEE ARTHROPLASTY;  Surgeon: Corky Mull, MD;  Location: [facility] ORS;  Service: Orthopedics;  Laterality: Right;    SOCIAL HISTORY:   Social History   Tobacco Use  . Smoking status: Former Research scientist (life sciences)  . Smokeless tobacco: Never Used  Substance Use Topics  . Alcohol use: No    FAMILY HISTORY:  No family history on file.  DRUG ALLERGIES:   Allergies  Allergen Reactions  . Ciprofloxacin Other (See Comments)    Hallucinations      REVIEW OF SYSTEMS:   Review of Systems  Constitutional: Positive for malaise/fatigue. Negative for chills and fever.  HENT: Negative.  Negative for ear discharge, ear pain, hearing loss, nosebleeds and sore throat.   Eyes: Negative.  Negative for blurred vision and pain.  Respiratory: Positive for cough and shortness of breath. Negative for  hemoptysis and wheezing.   Cardiovascular: Positive for orthopnea, leg swelling and PND. Negative for chest pain and palpitations.  Gastrointestinal: Negative.  Negative for abdominal pain, blood in stool, diarrhea, nausea and vomiting.  Genitourinary: Negative.  Negative for dysuria.  Musculoskeletal: Negative.  Negative for back pain.  Skin: Negative.   Neurological: Negative for dizziness, tremors, speech change, focal weakness, seizures and headaches.  Endo/Heme/Allergies: Negative.  Does not bruise/bleed easily.  Psychiatric/Behavioral: Negative.  Negative for depression, hallucinations and suicidal ideas.    MEDICATIONS AT HOME:    Prior to Admission medications   Medication Sig Start Date End Date Taking? Authorizing Provider  acetaminophen (TYLENOL) 650 MG CR tablet Take 650 mg by mouth daily.    [provider]  aspirin EC 81 MG tablet Take 81 mg by mouth daily.     [provider]  cholecalciferol (VITAMIN D) 400 units TABS tablet Take 400 Units by mouth daily.    [provider]  Glucosamine-Chondroitin (OSTEO BI-FLEX REGULAR STRENGTH PO) Take 1 tablet by mouth 2 (two) times daily.     [provider]  lisinopril (PRINIVIL,ZESTRIL) 10 MG tablet Take 10 mg by mouth daily with breakfast.  02/11/15   [provider]  NON FORMULARY Place 1 drop into the right eye 2 (two) times daily. Pred-Gati-Brom (Prednisolone Acetate/Gatifloxacin/Bromfenac)     [provider]  traMADol (ULTRAM) 50 MG tablet Take 50 mg by mouth every 8 (eight) hours as needed for moderate pain. 02/22/18   [provider]      VITAL SIGNS:  Blood pressure 127/86, pulse (!) 104, temperature 97.6 F (36.4 C), temperature source Oral, resp. rate (!) 28, height 5\' 4"  (1.626 m), weight 57.2 kg, SpO2 (!) 87 %.  PHYSICAL EXAMINATION:   Physical Exam  Constitutional: She is oriented to person, place, and time.  Non-toxic appearance. She appears ill. No distress.  HENT:  Head: Normocephalic.  Mouth/Throat: Oropharynx is clear and moist.  Eyes: No scleral icterus.  Neck: Normal range of motion. Neck supple. No JVD present. No tracheal deviation present.  Cardiovascular: Normal rate and regular rhythm. Exam reveals no gallop and no friction rub.  Murmur heard. Pulmonary/Chest: Accessory muscle usage present. Tachypnea noted. She is in respiratory distress. She has decreased breath sounds. She has no wheezes. She has rales. She exhibits no tenderness.  Abdominal: Soft. Bowel sounds are normal. She exhibits no distension and no mass. There is no tenderness. There is no rebound and no guarding.   Musculoskeletal: Normal range of motion.       Right lower leg: She exhibits edema. She exhibits no tenderness.       Left lower leg: She exhibits edema. She exhibits no tenderness.  Neurological: She is alert and oriented to person, place, and time.  Skin: Skin is warm. No rash noted. No erythema.  Psychiatric: She has a normal mood and affect. Her behavior is normal. Judgment normal.      LABORATORY PANEL:   CBC Recent Labs  Lab 05/23/2018 1523  WBC 13.1*  HGB 12.4  HCT 37.1  PLT 264   ------------------------------------------------------------------------------------------------------------------  Chemistries  Recent Labs  Lab 05/15/2018 1523  NA 124*  K 4.1  CL 95*  CO2 17*  GLUCOSE 292*  BUN 23  CREATININE 0.70  CALCIUM 9.0   ------------------------------------------------------------------------------------------------------------------  Cardiac Enzymes Recent Labs  Lab 05/28/2018 1523  TROPONINI 5.75*   ------------------------------------------------------------------------------------------------------------------  RADIOLOGY:  Dg Chest 2 View  Result Date: 05/17/2018 CLINICAL DATA:  Shortness  of breath. EXAM: CHEST - 2 VIEW COMPARISON:  05/21/2016 FINDINGS: There is a moderate to large left pleural effusion and a small to moderate right pleural effusion. Moderate diffuse pulmonary edema is identified bilaterally. Background chronic lung disease is identified, likely emphysema. The cardiac silhouette is obscured by left pleural effusion. IMPRESSION: 1. Suspect moderate congestive heart failure. 2.  Emphysema (ICD10-J43.9). Electronically Signed   By: Kerby Moors M.D.   On: 05/18/2018 16:12    EKG:  ST changes in the inferior leads no ST elevation MI according to Dr. Clayborn Bigness  IMPRESSION AND PLAN:   84 year old female with history of breast cancer who presents to the ER due to shortness of breath.  1.  Acute hypoxic respiratory failure in the  setting of pulmonary edema seen on chest x-ray Patient currently on BiPAP and will be weaned as tolerated Case discussed with intensivist on-call.  2.  New onset pulmonary edema with PND, orthopnea and lower extremity edema and large left pleural effusion: Continue IV Lasix Monitor intake and output with daily weight  check echocardiogram [facility] Cardiology consult placed via epic Check BNP Will place order for thoracentesis with labs  3.  Hyponatremia: Check TSH Continue to monitor closely as patient will be on Lasix Suspect this could be due to underlying pulmonary edema  4.  Non-ST elevation MI: Continue heparin drip and aspirin Check lipid panel and A1c Due to pulmonary edema I will not start beta-blocker at this time however when she is euvolemic then beta-blocker may be started. Follow-up on echocardiogram and cardiology recommendations Follow troponins and telemetry monitoring  5.  Essential hypertension: Continue lisinopril  6.  Prediabetes: Sliding scale initiated  Patient critically ill and discussed with patient's family.   All the records are reviewed and case discussed with ED provider. Management plans discussed with the patient and family and they are in agreement  CODE STATUS: DNR Critical care TOTAL TIME TAKING CARE OF THIS PATIENT: 55 minutes.    Melanie Openshaw M.D on 05/25/2018 at 4:43 PM  Between 7am to 6pm - Pager - [phone]  After 6pm go to [email or web address] - password [email or web address] [facility]  Office  [phone]  CC: Primary care physician; Dion Body, MD

## 2018-06-02 NOTE — ED Notes (Signed)
Respiratory at bedside.

## 2018-06-02 NOTE — ED Provider Notes (Signed)
[facility] Emergency Department Provider Note  ____________________________________________   I have reviewed the triage vital signs and the nursing notes.   HISTORY  Chief Complaint Shortness of Breath   History limited by: Not Limited   HPI Melinda Little is a 84 y.o. female who presents to the emergency department today because of concern for shortness of breath. Patient states symptoms have been present for roughly 2 weeks. Had initially been intermittent however have continued to get worse. She has had a non productive cough associated with the shortness of breath. She denies any chest pain but has noticed some fluttering. The patient denies any recent illness. Denies any fevers. Denies any lower extremity swelling.   Per medical record review patient has a history of anemia, heart murmur.   Past Medical History:  Diagnosis Date  . Anemia   . Breast cancer ([place]) 2012   right breast/mammosite/radiation  . Breast cancer, right [facility]) 2012   right breast with lumpectomy and mammosite rad tx  . Dyspnea    DOE  . Heart murmur   . Hypertension   . Pain    BACK/ RECENT STEROID INJECTION  . Pre-diabetes   . Tremor    HEAD    Patient Active Problem List   Diagnosis Date Noted  . Ductal carcinoma in situ (DCIS) of right breast 10/30/2016  . Status post total right knee replacement using cement 05/19/2016    Past Surgical History:  Procedure Laterality Date  . ABDOMINAL HYSTERECTOMY    . BREAST BIOPSY Right 2012   positve  . BREAST BIOPSY Right 2012   negative  . BREAST LUMPECTOMY    . CATARACT EXTRACTION W/PHACO Right 03/22/2018   Procedure: CATARACT EXTRACTION PHACO AND INTRAOCULAR LENS PLACEMENT (IOC);  Surgeon: Birder Robson, MD;  Location: [facility] ORS;  Service: Ophthalmology;  Laterality: Right;  [place] 01:18 AP% 19.0 CDE 14.95 Fluid pack lot # [id number]  . CATARACT EXTRACTION W/PHACO Left 04/12/2018   Procedure: CATARACT EXTRACTION PHACO  AND INTRAOCULAR LENS PLACEMENT (IOC);  Surgeon: Birder Robson, MD;  Location: [facility] ORS;  Service: Ophthalmology;  Laterality: Left;  [place]   01:18 AP%  CDE 16.41 Fluid pack lot # [id number]  . COLON SURGERY    . JOINT REPLACEMENT     left knee 2005  . PARTIAL HYSTERECTOMY  1978  . TOTAL KNEE ARTHROPLASTY Right 05/19/2016   Procedure: TOTAL KNEE ARTHROPLASTY;  Surgeon: Corky Mull, MD;  Location: [facility] ORS;  Service: Orthopedics;  Laterality: Right;    Prior to Admission medications   Medication Sig Start Date End Date Taking? Authorizing Provider  acetaminophen (TYLENOL) 650 MG CR tablet Take 650 mg by mouth daily.    [provider]  aspirin EC 81 MG tablet Take 81 mg by mouth daily.     [provider]  cholecalciferol (VITAMIN D) 400 units TABS tablet Take 400 Units by mouth daily.    [provider]  Glucosamine-Chondroitin (OSTEO BI-FLEX REGULAR STRENGTH PO) Take 1 tablet by mouth 2 (two) times daily.     [provider]  lisinopril (PRINIVIL,ZESTRIL) 10 MG tablet Take 10 mg by mouth daily with breakfast.  02/11/15   [provider]  NON FORMULARY Place 1 drop into the right eye 2 (two) times daily. Pred-Gati-Brom (Prednisolone Acetate/Gatifloxacin/Bromfenac)     [provider]  traMADol (ULTRAM) 50 MG tablet Take 50 mg by mouth every 8 (eight) hours as needed for moderate pain. 02/22/18   [provider]  Allergies Ciprofloxacin  No family history on file.  Social History Social History   Tobacco Use  . Smoking status: Former Research scientist (life sciences)  . Smokeless tobacco: Never Used  Substance Use Topics  . Alcohol use: No  . Drug use: No    Review of Systems Constitutional: No fever/chills Eyes: No visual changes. ENT: No sore throat. Cardiovascular: Denies chest pain. Positive for heart fluttering.  Respiratory: Positive for shortness of breath. Gastrointestinal: No abdominal pain.  No nausea, no vomiting.  No diarrhea.    Genitourinary: Negative for dysuria. Musculoskeletal: Negative for back pain. Skin: Negative for rash. Neurological: Negative for headaches, focal weakness or numbness.  ____________________________________________   PHYSICAL EXAM:  VITAL SIGNS: ED Triage Vitals  Enc Vitals Group     BP 06/09/2018 1518 (!) 148/83     Pulse Rate 05/24/2018 1518 (!) 114     Resp 05/26/2018 1518 (!) 28     Temp 05/23/2018 1518 97.6 F (36.4 C)     Temp Source 05/30/2018 1518 Oral     SpO2 05/19/2018 1518 (!) 86 %     Weight 06/04/2018 1521 126 lb (57.2 kg)     Height 06/04/2018 1521 5\' 4"  (1.626 m)     Head Circumference --      Peak Flow --      Pain Score 05/14/2018 1521 0   Constitutional: Alert and oriented.  Eyes: Conjunctivae are normal.  ENT      Head: Normocephalic and atraumatic.      Nose: No congestion/rhinnorhea.      Mouth/Throat: Mucous membranes are moist.      Neck: No stridor. Hematological/Lymphatic/Immunilogical: No cervical lymphadenopathy. Cardiovascular: Tachycardic, regular rhythm. Systolic murmur.  Respiratory: Tachypnea, increased respiratory effort. Decreased breath sounds over the left lung.  Gastrointestinal: Soft and non tender. No rebound. No guarding.  Genitourinary: Deferred Musculoskeletal: Normal range of motion in all extremities. Trace lower extremity edema.  Neurologic:  Normal speech and language. No gross focal neurologic deficits are appreciated.  Skin:  Skin is warm, dry and intact. No rash noted. Psychiatric: Mood and affect are normal. Speech and behavior are normal. Patient exhibits appropriate insight and judgment.  ____________________________________________    LABS (pertinent positives/negatives)  CBC wbc 13.1, hgb 12.4, plt 264 Trop 5.75 BMP na 124, chloride 95, glu 292, cr 0.70  ____________________________________________   EKG  I, Nance Pear, attending physician, personally viewed and interpreted this EKG  EKG Time: 1531 Rate:  113 Rhythm: sinus tachycardia Axis: left axis deviation Intervals: qtc 419 QRS: narrow, q waves III, V1 ST changes: ST elevation III, V1, t wave inversion V4-V5 Impression: abnormal ekg  ____________________________________________    RADIOLOGY  CXR Moderate to large left pleural effusion, pulmonary edema  I, Nance Pear, personally viewed and evaluated these images (plain radiographs) as part of my medical decision making. ____________________________________________   PROCEDURES  Procedures  CRITICAL CARE Performed by: Nance Pear   Total critical care time: 45 minutes  Critical care time was exclusive of separately billable procedures and treating other patients.  Critical care was necessary to treat or prevent imminent or life-threatening deterioration.  Critical care was time spent personally by me on the following activities: development of treatment plan with patient and/or surrogate as well as nursing, discussions with consultants, evaluation of patient's response to treatment, examination of patient, obtaining history from patient or surrogate, ordering and performing treatments and interventions, ordering and review of laboratory studies, ordering and review of radiographic studies, pulse oximetry and re-evaluation of  patient's condition.  ____________________________________________   INITIAL IMPRESSION / ASSESSMENT AND PLAN / ED COURSE  Pertinent labs & imaging results that were available during my care of the patient were reviewed by me and considered in my medical decision making (see chart for details).   Presented to the emergency department today because of concerns for shortness of breath.  On exam patient does have some tachypnea.  Breath sounds are diminished over the left lung.  Patient with trace lower extremity edema.  EKG did show some concerning changes and findings.  I discussed with Dr. Clayborn Bigness who evaluated the EKG.  At this point does  not feel it meets STEMI criteria.  Patient's x-ray was notable for a moderate to large left pleural effusion and edema.  I do think this likely would explain the patient's shortness of breath.  Troponin was also quite elevated.  I do have concern that perhaps patient suffered a heart attack which caused the pulmonary edema.  I discussed these findings with the patient and family.  Will initiate heparin.  Will plan on admission.  ____________________________________________   FINAL CLINICAL IMPRESSION(S) / ED DIAGNOSES  Final diagnoses:  Elevated troponin  Pleural effusion  Acute pulmonary edema (HCC)  Hypoxia  SOB (shortness of breath)     Note: This dictation was prepared with Dragon dictation. Any transcriptional errors that result from this process are unintentional     Nance Pear, MD 05/20/2018 1659

## 2018-06-02 NOTE — Progress Notes (Signed)
   05/24/2018 1900  Clinical Encounter Type  Visited With Patient and family together  Visit Type Initial  Referral From Nurse  Consult/Referral To Chaplain  Spiritual Encounters  Spiritual Needs Brochure;Prayer;Emotional  [place] entered room to see patient seated on raised hospital bed. Had BiPaP machine which inhibited communication. Patient's husband and two children were present upon arrival. Introduced self and asked if they were interested in AD info. Family agreed. [place] returned several minutes later with AD info and shared that a chaplain will visit tomorrow to assist in completing document. Prayer was requested. [place] provided pastoral care through prayer, education, and being a non-judgmental hopeful presence.

## 2018-06-02 NOTE — ED Notes (Signed)
Pt diaphoretic, tachypneic, and 87% on nasal cannula. MD Archie Balboa at bedside. Pt placed on non rebreather mask.

## 2018-06-02 NOTE — Consult Note (Signed)
Name: Lovinia Little MRN: [id number] DOB: 1929/10/09    ADMISSION DATE:  06/03/2018 CONSULTATION DATE: 05/23/2018  REFERRING MD : Dr. Benjie Karvonen   CHIEF COMPLAINT: Shortness of Breath   BRIEF PATIENT DESCRIPTION:  84 yo female admitted with acute hypoxic respiratory failure secondary to pulmonary edema and bilateral pleural effusions left greater than right requiring Bipap and NSTEMI   SIGNIFICANT EVENTS  11/21-Pt admitted to the stepdown unit on Bipap   HISTORY OF PRESENT ILLNESS:  This is an 84 yo female with a PMH of Head Tremor, Prediabetes, HTN, Right Breast Cancer s/p Lumpectomy and Radiation, and Anemia.  She presented to [facility] ER on 11/21 with worsening shortness of breath onset of symptoms 2 weeks prior to presentation.  In the ER pt hypoxic with O2 sats 85% on RA, tachypneic, and diaphoretic, she was placed on 4L via nasal canula O2 sats increased to 87%.  Due to continued decline in respiratory status she was placed on Bipap.  CXR showed pulmonary edema and bilateral pleural effusions left greater than right.  She does not have a CHF hx. Lab results revealed Na+ 124, CO2 17, glucose 292, troponin 5.75, wbc 13.1, PT 12.1, and INR 0.90.  Initial EKG revealed sinus tachycardia with anterior ST elevation, however Cardiologist Dr. Clayborn Bigness contacted by ER physician to evaluate EKG, after review Dr. Clayborn Bigness determined pt did not meet STEMI critieria. In the ER she received aspirin 324 mg po x1 dose, 40 mg iv lasix, and heparin bolus with initiation of drip.  She was subsequently admitted to the stepdown unit for additional workup and treatment of NSTEMI, and Acute Hypoxic Respiratory Failure secondary to pulmonary edema and bilateral pleural effusions.  PCCM is consulted for further management.  PAST MEDICAL HISTORY :   has a past medical history of Anemia, Breast cancer ([place]) (2012), Breast cancer, right ([place]) (2012), Dyspnea, Heart murmur, Hypertension, Pain, Pre-diabetes, and Tremor.  has  a past surgical history that includes Breast biopsy (Right, 2012); Breast biopsy (Right, 2012); Breast lumpectomy; Partial hysterectomy (1978); Abdominal hysterectomy; Colon surgery; Total knee arthroplasty (Right, 05/19/2016); Cataract extraction w/PHACO (Right, 03/22/2018); Joint replacement; and Cataract extraction w/PHACO (Left, 04/12/2018). Prior to Admission medications   Medication Sig Start Date End Date Taking? Authorizing Provider  aspirin EC 81 MG tablet Take 81 mg by mouth daily.    Yes [provider]  lisinopril (PRINIVIL,ZESTRIL) 10 MG tablet Take 10 mg by mouth daily with breakfast.  02/11/15  Yes [provider]  traMADol (ULTRAM) 50 MG tablet Take 50 mg by mouth every 8 (eight) hours as needed for moderate pain. 02/22/18  Yes [provider]   Allergies  Allergen Reactions  . Ciprofloxacin Other (See Comments)    Hallucinations      FAMILY HISTORY:  family history is not on file. SOCIAL HISTORY:  reports that she has quit smoking. She has never used smokeless tobacco. She reports that she does not drink alcohol or use drugs.  REVIEW OF SYSTEMS:  Positives in BOLD Constitutional: Negative for fever, chills, weight loss, +malaise/fatigue and diaphoresis.  HENT: Negative for hearing loss, ear pain, nosebleeds, congestion, sore throat, neck pain, tinnitus and ear discharge.   Eyes: Negative for blurred vision, double vision, photophobia, pain, discharge and redness.  Respiratory: Negative for cough, hemoptysis, sputum production, +shortness of breath, wheezing and stridor.   Cardiovascular: Negative for chest pain, palpitations, orthopnea, claudication, leg swelling and PND.  Gastrointestinal: Negative for heartburn, nausea, vomiting, abdominal pain, diarrhea, constipation, blood in stool  and melena.  Genitourinary: Negative for dysuria, urgency, frequency, hematuria and flank pain.  Musculoskeletal: Negative for myalgias, back pain, joint pain and falls.   Skin: Negative for itching and rash.  Neurological: Negative for dizziness, tingling, tremors, sensory change, speech change, focal weakness, seizures, loss of consciousness, weakness and headaches.  Endo/Heme/Allergies: Negative for environmental allergies and polydipsia. Does not bruise/bleed easily.  SUBJECTIVE:  Reports she is slightly short of breath, much improved on Bipap She reports she "feels congested" Denies cough, sputum production, fever, chills, chest pain, palpitations, or edema Currently on BiPAP 50% FiO2  VITAL SIGNS: Temp:  [97.6 F (36.4 C)] 97.6 F (36.4 C) (11/21 1518) Pulse Rate:  [88-114] 88 (11/21 1830) Resp:  [24-38] 34 (11/21 1830) BP: (102-148)/(50-86) 105/66 (11/21 1830) SpO2:  [86 %-96 %] 96 % (11/21 1830) Weight:  [57.2 kg] 57.2 kg (11/21 1521)  PHYSICAL EXAMINATION: General:  Acutely ill appearing female, sitting in bed, on BiPAP 50%, in NAD Neuro:  Awake, A&O x4, Follows commands, no focal deficits, clear speech HEENT:  Atraumatic, normocephalic, neck supple, no JVD Cardiovascular:  RRR, s1s2, no M/R/G, 2+ radial pulses, 1+ pedal pulses bilaterally Lungs:  Fine crackles to left base, otherwise clear to auscultation, even, tachypnea, BiPAP assisted Abdomen:  Soft, nontender, nondistended, BS+ x4 Musculoskeletal:  No deformities, normal bulk and tone, no edema Skin:  Warm, dry.  No obvious rashes, lesions, or ulcerations  Recent Labs  Lab 06/07/2018 1523  NA 124*  K 4.1  CL 95*  CO2 17*  BUN 23  CREATININE 0.70  GLUCOSE 292*   Recent Labs  Lab 05/16/2018 1523  HGB 12.4  HCT 37.1  WBC 13.1*  PLT 264   Dg Chest 2 View  Result Date: 06/10/2018 CLINICAL DATA:  Shortness of breath. EXAM: CHEST - 2 VIEW COMPARISON:  05/21/2016 FINDINGS: There is a moderate to large left pleural effusion and a small to moderate right pleural effusion. Moderate diffuse pulmonary edema is identified bilaterally. Background chronic lung disease is identified,  likely emphysema. The cardiac silhouette is obscured by left pleural effusion. IMPRESSION: 1. Suspect moderate congestive heart failure. 2.  Emphysema (ICD10-J43.9). Electronically Signed   By: Kerby Moors M.D.   On: 06/08/2018 16:12    ASSESSMENT / PLAN:  NSTEMI -Troponin 5.75 > Acute Decompensated CHF Hx: HTN, heart murmur -Cardiac monitoring -Maintain MAP >65 -Levophed if needed to maintain MAP goal -Heparin gtt -Continue ASA and Lisinopril -Cardiology following, appreciate input -Obtain Echocardiogram -IV Lasix (Received 40 mg in ED x1 dose, Scheduled 20 mg IV BID) -Hold off on Beta-blockers for now until BP improves  Acute Hypoxic Respiratory Failure in setting of CHF exacerbation, Pleural effusions, and NSTEMI -Supplemental O2 to maintain O2 sats >92% -BiPAP, wean as tolerated -Follow intermittent ABG and CXR -IV Lasix -To undergo Thoracentesis  Metabolic Acidosis Hypotonic Hypervolemic Hyponatremia -Monitor I&O's / urinary output -Follow BMP -Ensure adequate renal perfusion -Avoid nephrotoxic agents as able -Replace electrolytes as indicated -Check Lactic Acid -Restrict Free water -IV Lasix  Mild Leukocytosis, likely in setting of NSTEMI -Monitor fever curve -Trend WBC's  Hyperglycemia -CBG's -SSI -Follow ICU Hypo/hyperglycemia protocol   DISPOSITION: Stepdown GOALS OF CARE:  DNR VTE PROPHYLAXIS: Heparin drip UPDATES: Updated pt, pt's son and daughter at bedside 06/03/2018.  Pt and family confirm her DNR status.   Darel Hong, AGACNP-BC [facility] Pager: [phone] Cell: [phone]

## 2018-06-02 NOTE — ED Triage Notes (Addendum)
Patient to ED with family complaining of worsening shortness of breath for the last two weeks. States today she feels like she cannot catch her breath. Patient tachypnic and diaphoretic. 85% on room air. Placed on 4 L [place] with increase to 87%. Appears anxious. Patient denies pain.

## 2018-06-03 ENCOUNTER — Encounter: Admission: EM | Disposition: E | Payer: Self-pay | Source: Home / Self Care | Attending: Internal Medicine

## 2018-06-03 ENCOUNTER — Encounter: Payer: Self-pay | Admitting: Internal Medicine

## 2018-06-03 ENCOUNTER — Inpatient Hospital Stay: Payer: Medicare Other

## 2018-06-03 ENCOUNTER — Inpatient Hospital Stay
Admit: 2018-06-03 | Discharge: 2018-06-03 | Disposition: A | Discharge status: Home / Self Care | Payer: Medicare Other | Attending: Internal Medicine | Admitting: Internal Medicine

## 2018-06-03 DIAGNOSIS — E440 Moderate protein-calorie malnutrition: Secondary | ICD-10-CM

## 2018-06-03 DIAGNOSIS — I214 Non-ST elevation (NSTEMI) myocardial infarction: Principal | ICD-10-CM

## 2018-06-03 DIAGNOSIS — J810 Acute pulmonary edema: Secondary | ICD-10-CM

## 2018-06-03 HISTORY — PX: LEFT HEART CATH AND CORONARY ANGIOGRAPHY: CATH118249

## 2018-06-03 LAB — PHOSPHORUS: Phosphorus: 4.3 mg/dL (ref 2.5–4.6)

## 2018-06-03 LAB — LACTATE DEHYDROGENASE, PLEURAL OR PERITONEAL FLUID: LD, Fluid: 64 U/L — ABNORMAL HIGH (ref 3–23)

## 2018-06-03 LAB — ALBUMIN, PLEURAL OR PERITONEAL FLUID: ALBUMIN FL: 1.3 g/dL

## 2018-06-03 LAB — AMYLASE, PLEURAL OR PERITONEAL FLUID: Amylase, Fluid: 10 U/L

## 2018-06-03 LAB — BRAIN NATRIURETIC PEPTIDE: B NATRIURETIC PEPTIDE 5: 3266 pg/mL — AB (ref 0.0–100.0)

## 2018-06-03 LAB — BODY FLUID CELL COUNT WITH DIFFERENTIAL
Eos, Fluid: 0 %
Lymphs, Fluid: 19 %
MONOCYTE-MACROPHAGE-SEROUS FLUID: 74 %
NEUTROPHIL FLUID: 7 %
Other Cells, Fluid: 0 %
WBC FLUID: 394 uL

## 2018-06-03 LAB — CBC
HCT: 33.1 % — ABNORMAL LOW (ref 36.0–46.0)
Hemoglobin: 11.1 g/dL — ABNORMAL LOW (ref 12.0–15.0)
MCH: 30.6 pg (ref 26.0–34.0)
MCHC: 33.5 g/dL (ref 30.0–36.0)
MCV: 91.2 fL (ref 80.0–100.0)
PLATELETS: 216 10*3/uL (ref 150–400)
RBC: 3.63 MIL/uL — ABNORMAL LOW (ref 3.87–5.11)
RDW: 13.1 % (ref 11.5–15.5)
WBC: 11.4 10*3/uL — ABNORMAL HIGH (ref 4.0–10.5)
nRBC: 0 % (ref 0.0–0.2)

## 2018-06-03 LAB — BASIC METABOLIC PANEL
Anion gap: 7 (ref 5–15)
BUN: 26 mg/dL — AB (ref 8–23)
CALCIUM: 8.5 mg/dL — AB (ref 8.9–10.3)
CO2: 20 mmol/L — AB (ref 22–32)
CREATININE: 0.75 mg/dL (ref 0.44–1.00)
Chloride: 100 mmol/L (ref 98–111)
GFR calc Af Amer: >60 mL/min (ref >60)
GLUCOSE: 135 mg/dL — AB (ref 70–99)
Potassium: 4.5 mmol/L (ref 3.5–5.1)
Sodium: 127 mmol/L — ABNORMAL LOW (ref 135–145)

## 2018-06-03 LAB — GLUCOSE, CAPILLARY
GLUCOSE-CAPILLARY: 150 mg/dL — AB (ref 70–99)
Glucose-Capillary: 104 mg/dL — ABNORMAL HIGH (ref 70–99)
Glucose-Capillary: 124 mg/dL — ABNORMAL HIGH (ref 70–99)
Glucose-Capillary: 110 mg/dL — ABNORMAL HIGH (ref 70–99)

## 2018-06-03 LAB — LIPID PANEL
CHOLESTEROL: 168 mg/dL (ref 0–200)
HDL: 78 mg/dL (ref >40)
LDL CALC: 83 mg/dL (ref 0–99)
Total CHOL/HDL Ratio: 2.2 RATIO
Triglycerides: 34 mg/dL (ref <150)
VLDL: 7 mg/dL (ref 0–40)

## 2018-06-03 LAB — TROPONIN I
TROPONIN I: 10.77 ng/mL — AB (ref <0.03)
Troponin I: 20.63 ng/mL (ref <0.03)

## 2018-06-03 LAB — MAGNESIUM: Magnesium: 2.1 mg/dL (ref 1.7–2.4)

## 2018-06-03 LAB — PROTEIN, PLEURAL OR PERITONEAL FLUID

## 2018-06-03 LAB — GLUCOSE, PLEURAL OR PERITONEAL FLUID: GLUCOSE FL: 134 mg/dL

## 2018-06-03 LAB — HEMOGLOBIN A1C
HEMOGLOBIN A1C: 5.4 % (ref 4.8–5.6)
MEAN PLASMA GLUCOSE: 108.28 mg/dL

## 2018-06-03 LAB — HEPARIN LEVEL (UNFRACTIONATED): HEPARIN UNFRACTIONATED: 0.2 [IU]/mL — AB (ref 0.30–0.70)

## 2018-06-03 SURGERY — LEFT HEART CATH AND CORONARY ANGIOGRAPHY
Anesthesia: Moderate Sedation

## 2018-06-03 MED ORDER — SODIUM CHLORIDE 0.9% FLUSH: 3.0000 mL | INTRAVENOUS | Status: DC | PRN

## 2018-06-03 MED ORDER — ADULT MULTIVITAMIN W/MINERALS CH
1.0000 | ORAL_TABLET | Freq: Every day | ORAL | Status: DC
  Administered 2018-06-04: 1 via ORAL
  Filled 2018-06-03: qty 1

## 2018-06-03 MED ORDER — ENSURE ENLIVE PO LIQD: 237.0000 mL | Freq: Two times a day (BID) | ORAL | Status: DC

## 2018-06-03 MED ORDER — SODIUM CHLORIDE 0.9 % IV SOLN
1.0000 g | Freq: Every day | INTRAVENOUS | Status: DC
  Administered 2018-06-03 – 2018-06-04 (×2): 1 g via INTRAVENOUS
  Filled 2018-06-03: qty 1
  Filled 2018-06-03: qty 10
  Filled 2018-06-03: qty 1

## 2018-06-03 MED ORDER — SODIUM CHLORIDE 0.9% FLUSH: 3.0000 mL | Freq: Two times a day (BID) | INTRAVENOUS | Status: DC

## 2018-06-03 MED ORDER — ASPIRIN 81 MG PO CHEW: 81.0000 mg | CHEWABLE_TABLET | ORAL | Status: DC

## 2018-06-03 MED ORDER — NOREPINEPHRINE 4 MG/250ML-% IV SOLN
0.0000 ug/min | INTRAVENOUS | Status: DC
  Administered 2018-06-03: 2 ug/min via INTRAVENOUS
  Filled 2018-06-03: qty 250

## 2018-06-03 MED ORDER — ATORVASTATIN CALCIUM 10 MG PO TABS
10.0000 mg | ORAL_TABLET | Freq: Every day | ORAL | Status: DC
  Administered 2018-06-03 – 2018-06-04 (×2): 10 mg via ORAL
  Filled 2018-06-03 (×2): qty 1

## 2018-06-03 MED ORDER — SODIUM CHLORIDE 0.9 % IV SOLN
0.0000 ug/min | INTRAVENOUS | Status: DC
  Administered 2018-06-03: 80 ug/min via INTRAVENOUS
  Filled 2018-06-03: qty 4

## 2018-06-03 MED ORDER — PHENYLEPHRINE HCL-NACL 10-0.9 MG/250ML-% IV SOLN
0.0000 ug/min | INTRAVENOUS | Status: DC
  Administered 2018-06-03: 70 ug/min via INTRAVENOUS
  Administered 2018-06-04: 50 ug/min via INTRAVENOUS
  Filled 2018-06-03 (×2): qty 250
  Filled 2018-06-03: qty 10
  Filled 2018-06-03: qty 250

## 2018-06-03 MED ORDER — PHENYLEPHRINE HCL-NACL 10-0.9 MG/250ML-% IV SOLN
0.0000 ug/min | INTRAVENOUS | Status: DC
  Administered 2018-06-03: 20 ug/min via INTRAVENOUS
  Filled 2018-06-03 (×2): qty 250

## 2018-06-03 MED ORDER — SODIUM CHLORIDE 0.9 % IV SOLN: 250.0000 mL | INTRAVENOUS | Status: DC | PRN

## 2018-06-03 MED ORDER — SODIUM CHLORIDE 0.9 % WEIGHT BASED INFUSION: 1.0000 mL/kg/h | INTRAVENOUS | Status: DC

## 2018-06-03 MED ORDER — WHITE PETROLATUM EX OINT
TOPICAL_OINTMENT | CUTANEOUS | Status: DC | PRN
  Administered 2018-06-03: 1 via TOPICAL
  Filled 2018-06-03 (×2): qty 5

## 2018-06-03 MED ORDER — GUAIFENESIN-DM 100-10 MG/5ML PO SYRP
5.0000 mL | ORAL_SOLUTION | ORAL | Status: DC | PRN
  Filled 2018-06-03: qty 5

## 2018-06-03 MED ORDER — MORPHINE SULFATE (PF) 2 MG/ML IV SOLN
1.0000 mg | INTRAVENOUS | Status: DC | PRN
  Administered 2018-06-03 – 2018-06-04 (×3): 2 mg via INTRAVENOUS
  Filled 2018-06-03 (×4): qty 1

## 2018-06-03 MED ORDER — SODIUM CHLORIDE 0.9 % WEIGHT BASED INFUSION
3.0000 mL/kg/h | INTRAVENOUS | Status: DC
  Administered 2018-06-03: 3.007 mL/kg/h via INTRAVENOUS

## 2018-06-03 MED ORDER — IOPAMIDOL (ISOVUE-300) INJECTION 61%
INTRAVENOUS | Status: DC | PRN
  Administered 2018-06-03: 125 mL via INTRA_ARTERIAL

## 2018-06-03 MED ORDER — HEPARIN BOLUS VIA INFUSION
850.0000 [IU] | Freq: Once | INTRAVENOUS | Status: AC
  Administered 2018-06-03: 850 [IU] via INTRAVENOUS
  Filled 2018-06-03: qty 850

## 2018-06-03 MED ORDER — ALBUMIN HUMAN 25 % IV SOLN
25.0000 g | Freq: Four times a day (QID) | INTRAVENOUS | Status: AC
  Administered 2018-06-03 (×2): 25 g via INTRAVENOUS
  Filled 2018-06-03 (×2): qty 100

## 2018-06-03 MED ORDER — SODIUM CHLORIDE 0.9 % IV SOLN
0.0000 ug/min | INTRAVENOUS | Status: DC
  Administered 2018-06-03 (×2): 100 ug/min via INTRAVENOUS
  Filled 2018-06-03: qty 10
  Filled 2018-06-03: qty 1

## 2018-06-03 MED ORDER — PHENYLEPHRINE HCL-NACL 10-0.9 MG/250ML-% IV SOLN
0.0000 ug/min | INTRAVENOUS | Status: DC
  Filled 2018-06-03: qty 250

## 2018-06-03 MED ORDER — HEPARIN (PORCINE) 25000 UT/250ML-% IV SOLN
950.0000 [IU]/h | INTRAVENOUS | Status: DC
  Administered 2018-06-04: 950 [IU]/h via INTRAVENOUS
  Filled 2018-06-03: qty 250

## 2018-06-03 MED ORDER — HEPARIN (PORCINE) 25000 UT/250ML-% IV SOLN: 800.0000 [IU]/h | INTRAVENOUS | Status: DC

## 2018-06-03 MED ORDER — HEPARIN (PORCINE) IN NACL 1000-0.9 UT/500ML-% IV SOLN
INTRAVENOUS | Status: AC
  Filled 2018-06-03: qty 1000

## 2018-06-03 MED ORDER — SODIUM CHLORIDE 0.9 % IV SOLN
500.0000 mg | Freq: Every day | INTRAVENOUS | Status: DC
  Administered 2018-06-03 – 2018-06-04 (×2): 500 mg via INTRAVENOUS
  Filled 2018-06-03 (×3): qty 500

## 2018-06-03 MED ORDER — HYDROCODONE-ACETAMINOPHEN 5-325 MG PO TABS: 1.0000 | ORAL_TABLET | ORAL | Status: DC | PRN

## 2018-06-03 SURGICAL SUPPLY — 10 items
CATH INFINITI 5FR ANG PIGTAIL (CATHETERS) ×3 IMPLANT
CATH INFINITI 5FR JL4 (CATHETERS) ×3 IMPLANT
CATH INFINITI JR4 5F (CATHETERS) ×3 IMPLANT
DEVICE CLOSURE MYNXGRIP 5F (Vascular Products) ×3 IMPLANT
DEVICE SAFEGUARD 24CM (GAUZE/BANDAGES/DRESSINGS) ×3 IMPLANT
KIT MANI 3VAL PERCEP (MISCELLANEOUS) ×3 IMPLANT
NEEDLE PERC 18GX7CM (NEEDLE) ×3 IMPLANT
PACK CARDIAC CATH (CUSTOM PROCEDURE TRAY) ×3 IMPLANT
SHEATH AVANTI 5FR X 11CM (SHEATH) ×3 IMPLANT
WIRE GUIDERIGHT .035X150 (WIRE) ×3 IMPLANT

## 2018-06-03 NOTE — Consult Note (Signed)
ANTICOAGULATION CONSULT NOTE - Initial Consult  Pharmacy Consult for Heparin Indication: chest pain/ACS  Allergies  Allergen Reactions  . Ciprofloxacin Other (See Comments)    Hallucinations      Patient Measurements: Height: 5\' 4"  (162.6 cm) Weight: 126 lb 12.2 oz (57.5 kg) IBW/kg (Calculated) : 54.7 Heparin Dosing Weight: 57.2 kg  Vital Signs: Temp: 98.6 F (37 C) (11/22 0941) Temp Source: Axillary (11/22 0941) BP: 85/41 (11/22 1145) Pulse Rate: 110 (11/22 1130)  Labs: Recent Labs    06/06/2018 1523 06/07/2018 1630 05/24/2018 2251 05/14/2018 0128 06/04/2018 0419  HGB 12.4  --   --   --  11.1*  HCT 37.1  --   --   --  33.1*  PLT 264  --   --   --  216  APTT  --  26  --   --   --   LABPROT  --  12.1  --   --   --   INR  --  0.90  --   --   --   HEPARINUNFRC  --   --   --  0.20*  --   CREATININE 0.70  --   --   --  0.75  TROPONINI 5.75*  --  20.09*  --  20.63*    Estimated Creatinine Clearance: 43.6 mL/min (by C-G formula based on SCr of 0.75 mg/dL).   Medical History: Past Medical History:  Diagnosis Date  . Anemia   . Breast cancer ([place]) 2012   right breast/mammosite/radiation  . Breast cancer, right [facility]) 2012   right breast with lumpectomy and mammosite rad tx  . Dyspnea    DOE  . Heart murmur   . Hypertension   . Pain    BACK/ RECENT STEROID INJECTION  . Pre-diabetes   . Tremor    HEAD    Medications:  Scheduled:  . aspirin  81 mg Oral Daily  . atorvastatin  10 mg Oral q1800  . furosemide  20 mg Intravenous BID  . insulin aspart  0-9 Units Subcutaneous TID WC  . lisinopril  5 mg Oral Daily  . sodium chloride flush  3 mL Intravenous Q12H    Assessment: 84 yo female presented with complaints of worsening SOB x 2 weeks. Not on any PTA anticoagulants.  Goal of Therapy:  Heparin level 0.3-0.7 units/ml Monitor platelets by anticoagulation protocol: Yes   Plan:  11/22 @ 0900 Per cardiology note heparin drip should continue after cardiac cath and  thoracentesis for further risk reduction in further MI.  Heparin drip to start 4 hours after thoracentesis per protocol. New order for heparin 800 units/hr placed to start 11/22 @ 1530.  HL ordered 8 hours after heparin drip restart.  Continue to monitor CBC w/ am labs per protocol.  Pernell Dupre, PharmD, BCPS Clinical Pharmacist 05/20/2018 12:07 PM

## 2018-06-03 NOTE — Progress Notes (Signed)
Spoke with Dr. Soyla Murphy and made MD aware that patient only voided 50 cc overnight shift and that patient reports she does not feel like she has to pee.  Bladder scanned and result was 765 mL.  MD gave order to insert foley catheter.

## 2018-06-03 NOTE — Progress Notes (Signed)
Pt's troponin has risen to 20 (previously was 5.75).  Pt is currently resting on bipap, denies chest pain, in NAD.  O2 sats are in the upper 90's on Bipap.  Pt's BP is rather soft, latest BP 88/65 (74).  Orders placed for Levophed if needed to maintain MAP goal.  Called and spoke with Dr. Nehemiah Massed of Cardiology to update him on rise in troponin and downward trend in BP.  Per Dr. Nehemiah Massed, he is in agreement with Levophed drip if needed to maintain MAP.  He also recommends continuing heparin drip and to optimize oxygenation with BiPAP.  No further recommendations or change in plan at this time.  Plan will be to get pt to Cardiac catheterization tomorrow.    Darel Hong, AGACNP-BC [facility] Pager: [phone] Cell: [phone]

## 2018-06-03 NOTE — Progress Notes (Signed)
[facility] Encounter Note  Patient: Melinda Little / Admit Date: 05/25/2018 / Date of Encounter: 05/29/2018, 8:48 AM   Subjective: Patient still having significant amount of respiratory distress with some tachycardia and hypoxia requiring BiPAP. Chest x-ray with significant bibasilar pleural effusions more left than right consistent with previous congestive heart failure and severe aortic valve stenosis Cardiac catheterization showing significant proximal left anterior descending artery stenosis of 90% at small aneurysmal area likely culprit of myocardial infarction and elevation of troponin of 20 Echocardiogram showing apical akinesis and anterior hypokinesis with ejection fraction of 25 to 30% Velocities of aortic valve consistent with severe aortic stenosis at 4.5 m/s  Review of Systems: Positive for: Shortness of breath Negative for: Vision change, hearing change, syncope, dizziness, nausea, vomiting,diarrhea, bloody stool, stomach pain, cough, congestion, diaphoresis, urinary frequency, urinary pain,skin lesions, skin rashes Others previously listed  Objective: Telemetry: Sinus tachycardia with left ventricular hypertrophy Physical Exam: Blood pressure 99/67, pulse (!) 113, temperature 98.2 F (36.8 C), temperature source Oral, resp. rate (!) 31, height 5\' 4"  (1.626 m), weight 57.5 kg, SpO2 100 %. Body mass index is 21.76 kg/m. General: Well developed, well nourished, in no acute distress. Head: Normocephalic, atraumatic, sclera non-icteric, no xanthomas, nares are without discharge. Neck: No apparent masses Lungs: Increased respirations with no wheezes, no rhonchi, no rales , some basilar crackles bibasilar decreased breath sounds  Heart: Regular rate and rhythm, normal S1 soft S2, no 3+ aortic murmur, no rub, no gallop, PMI is normal size and placement, carotid upstroke normal without bruit, jugular venous pressure normal Abdomen: Soft, non-tender,  non-distended with normoactive bowel sounds. No hepatosplenomegaly. Abdominal aorta is normal size without bruit Extremities: No edema, no clubbing, no cyanosis, no ulcers,  Peripheral: 2+ radial, 2+ femoral, 2+ dorsal pedal pulses Neuro: Alert and oriented. Moves all extremities spontaneously. Psych:  Responds to questions appropriately with a normal affect.   Intake/Output Summary (Last 24 hours) at 05/19/2018 0848 Last data filed at 05/23/2018 0600 Gross per 24 hour  Intake 154.96 ml  Output 50 ml  Net 104.96 ml    Inpatient Medications:  . [MAR Hold] aspirin  81 mg Oral Daily  . [START ON 06/04/2018] aspirin  81 mg Oral Pre-Cath  . [MAR Hold] furosemide  20 mg Intravenous BID  . [MAR Hold] insulin aspart  0-9 Units Subcutaneous TID WC  . [MAR Hold] lisinopril  5 mg Oral Daily  . [MAR Hold] sodium chloride flush  3 mL Intravenous Q12H  . sodium chloride flush  3 mL Intravenous Q12H   Infusions:  . [MAR Hold] sodium chloride    . sodium chloride    . [START ON 06/04/2018] sodium chloride 3.007 mL/kg/hr (05/28/2018 7124)   Followed by  . [START ON 06/04/2018] sodium chloride    . heparin 800 Units/hr (06/01/2018 0600)  . [MAR Hold] norepinephrine (LEVOPHED) Adult infusion Stopped (06/01/2018 0526)    Labs: Recent Labs    05/29/2018 1523 05/14/2018 0419  NA 124* 127*  K 4.1 4.5  CL 95* 100  CO2 17* 20*  GLUCOSE 292* 135*  BUN 23 26*  CREATININE 0.70 0.75  CALCIUM 9.0 8.5*   No results for input(s): AST, ALT, ALKPHOS, BILITOT, PROT, ALBUMIN in the last 72 hours. Recent Labs    06/01/2018 1523 05/16/2018 0419  WBC 13.1* 11.4*  NEUTROABS 11.3*  --   HGB 12.4 11.1*  HCT 37.1 33.1*  MCV 91.8 91.2  PLT 264 216   Recent Labs  05/15/2018 1523 05/19/2018 2251 06/06/2018 0419  TROPONINI 5.75* 20.09* 20.63*   Invalid input(s): POCBNP No results for input(s): HGBA1C in the last 72 hours.   Weights: Filed Weights   06/09/2018 2137 05/24/2018 0339 06/03/18 0723  Weight: 56.6 kg  57.5 kg 57.5 kg     Radiology/Studies:  Dg Chest 2 View  Result Date: 05/19/2018 CLINICAL DATA:  Shortness of breath. EXAM: CHEST - 2 VIEW COMPARISON:  05/21/2016 FINDINGS: There is a moderate to large left pleural effusion and a small to moderate right pleural effusion. Moderate diffuse pulmonary edema is identified bilaterally. Background chronic lung disease is identified, likely emphysema. The cardiac silhouette is obscured by left pleural effusion. IMPRESSION: 1. Suspect moderate congestive heart failure. 2.  Emphysema (ICD10-J43.9). Electronically Signed   By: Kerby Moors M.D.   On: 05/16/2018 16:12     Assessment and Recommendation  84 y.o. female with significant progression of shortness of breath and congestive heart failure with chest pressure yesterday having EKG changes and troponin elevation consistent with anterior apical myocardial infarction.  Patient with tenuous respiratory distress but BiPAP with helping Cardiac catheterization showing appropriate stenosis of left anterior descending artery at 90% at aneurysmal spot  Echocardiogram showing severe apical and anterior hypokinesis with severe aortic valve stenosis 1.  BiPAP for significant hypoxia and shortness of breath with acute systolic dysfunction heart failure from myocardial infarction 2.  Thoracentesis of left to improve significant concerns of hypoxia and effusion 3.  Reinstate heparin after cardiac catheterization and thoracentesis for further risk reduction in further myocardial infarction 4.  Beta-blocker for heart rate control with myocardial infarction if able 5.  Further evaluation and treatment options for the possibility of aortic valve stenosis treatment and coronary artery bypass surgery  Signed, Serafina Royals M.D. FACC

## 2018-06-03 NOTE — Progress Notes (Signed)
   Name: Melinda Little MRN: [id number] DOB: 03/22/1930     CONSULTATION DATE: 05/14/2018  Subjective & Objectives: On BiPAP, s/p LHC, s/p Lt/ thoracentesis PAST MEDICAL HISTORY :   has a past medical history of Anemia, Breast cancer ([place]) (2012), Breast cancer, right ([place]) (2012), Dyspnea, Heart murmur, Hypertension, Pain, Pre-diabetes, and Tremor.  has a past surgical history that includes Breast biopsy (Right, 2012); Breast biopsy (Right, 2012); Breast lumpectomy; Partial hysterectomy (1978); Abdominal hysterectomy; Colon surgery; Total knee arthroplasty (Right, 05/19/2016); Cataract extraction w/PHACO (Right, 03/22/2018); Joint replacement; and Cataract extraction w/PHACO (Left, 04/12/2018). Prior to Admission medications   Medication Sig Start Date End Date Taking? Authorizing Provider  aspirin EC 81 MG tablet Take 81 mg by mouth daily.    Yes [provider]  lisinopril (PRINIVIL,ZESTRIL) 10 MG tablet Take 10 mg by mouth daily with breakfast.  02/11/15  Yes [provider]  traMADol (ULTRAM) 50 MG tablet Take 50 mg by mouth every 8 (eight) hours as needed for moderate pain. 02/22/18  Yes [provider]   Allergies  Allergen Reactions  . Ciprofloxacin Other (See Comments)    Hallucinations      FAMILY HISTORY:  family history is not on file. SOCIAL HISTORY:  reports that she has quit smoking. She has never used smokeless tobacco. She reports that she does not drink alcohol or use drugs.  REVIEW OF SYSTEMS:   Unable to obtain due to critical illness   VITAL SIGNS: Temp:  [97.6 F (36.4 C)-98.6 F (37 C)] 98.6 F (37 C) (11/22 0941) Pulse Rate:  [80-119] 106 (11/22 1110) Resp:  [24-40] 34 (11/22 1000) BP: (82-148)/(46-86) 97/67 (11/22 1110) SpO2:  [86 %-100 %] 100 % (11/22 1110) FiO2 (%):  [50 %] 50 % (11/22 1110) Weight:  [56.6 kg-57.5 kg] 57.5 kg (11/22 0723)  Physical Examination:  A + O and no focal acute neuro deficits On BiPAP, no  distress, BEAE and bibasilar fine crackles S1 & S2 audible and no murmur Benign abdomen with normal peristalses No leg edema   ASSESSMENT / PLAN:  Acute respiratory failure tolerating BiPAP. DNR -Monitor ABG and optimize BiPAP setting  NSTEMI. Proximal LAD 905 stenosis, cardiogenic shock and acute CHE with pulmonary edema and Pl. eff. Medical management as per cardiology -heparin gtt + ASA, Off BB because of hypotension requiring titration of pressers (neosynephrine because of hypotension with tachycardia and consider Dobutamine for inotropy to improve renal perfusion with diuresis).  Pulmonary edema and Bil. Pl eff s/p 800 ml of Lt.thoracentensis -Optimize diuresis to improve lung compliance  Atelectasis and possible pneumonia with infective etiology can not be rule out. Bibasilar airspace disease -Empiric Roceph + Zithro -Monitor CXR + CBC + FIO2  -Consider de-escalation of antimicrobials with clinical improvement.  Anemia -Keep HB > 8gm/dl.  Acute urine retention -Insert Foley  DNR  DVT & GI prophylaxis. Continue with supportive care Family was updated at the bedside and they agreed with current management Critical care time 45 min

## 2018-06-03 NOTE — Procedures (Signed)
US guided left thoracentesis.  Removed 800 ml of yellow fluid.  Minimal blood loss and no immediate complication. Will get CXR.

## 2018-06-03 NOTE — Progress Notes (Signed)
Pts son and husband requesting clarification of plan of care.  I spoke with Cardiologist Dr. Nehemiah Massed regarding pts family's concerned if pt needs to be transferred to another facility for potential CABG based on cardiac catheterization results from today.  Dr. Nehemiah Massed stated he is holding off on transferring the pt to another facility for potential CABG.  Per Dr. Nehemiah Massed pts respiratory status and blood pressure need to stabilize first prior to consideration of transfer.  The pt is still requiring Bipap s/p thoracentesis.   I discussed this with pts husband and son they stated they understood plan of care as listed above.  Pts husband and son also informed me the pt is not a DO NOT RESUSCITATE they would like her code status changed to Full Code they would want CPR, ACLS Medications, Cardioversion, and Mechanical Intubation unless the pt was deemed brain dead.  Therefore, code status changed to FULL CODE.  Marda Stalker, [place] Pager [phone] (please enter 7 digits) PCCM Consult Pager [phone] (please enter 7 digits)

## 2018-06-03 NOTE — Consult Note (Signed)
ANTICOAGULATION CONSULT NOTE - Initial Consult  Pharmacy Consult for Heparin Indication: chest pain/ACS  Allergies  Allergen Reactions  . Ciprofloxacin Other (See Comments)    Hallucinations      Patient Measurements: Height: 5\' 4"  (162.6 cm) Weight: 124 lb 12.5 oz (56.6 kg) IBW/kg (Calculated) : 54.7 Heparin Dosing Weight: 57.2 kg  Vital Signs: Temp: 98.3 F (36.8 C) (11/22 0200) Temp Source: Axillary (11/22 0200) BP: 88/64 (11/22 0200) Pulse Rate: 89 (11/22 0200)  Labs: Recent Labs    05/15/2018 1523 06/04/2018 1630 05/26/2018 2251 05/19/2018 0128  HGB 12.4  --   --   --   HCT 37.1  --   --   --   PLT 264  --   --   --   APTT  --  26  --   --   LABPROT  --  12.1  --   --   INR  --  0.90  --   --   HEPARINUNFRC  --   --   --  0.20*  CREATININE 0.70  --   --   --   TROPONINI 5.75*  --  20.09*  --     Estimated Creatinine Clearance: 43.6 mL/min (by C-G formula based on SCr of 0.7 mg/dL).   Medical History: Past Medical History:  Diagnosis Date  . Anemia   . Breast cancer ([place]) 2012   right breast/mammosite/radiation  . Breast cancer, right [facility]) 2012   right breast with lumpectomy and mammosite rad tx  . Dyspnea    DOE  . Heart murmur   . Hypertension   . Pain    BACK/ RECENT STEROID INJECTION  . Pre-diabetes   . Tremor    HEAD    Medications:  Scheduled:  . aspirin  81 mg Oral Daily  . furosemide  20 mg Intravenous BID  . heparin  850 Units Intravenous Once  . insulin aspart  0-9 Units Subcutaneous TID WC  . lisinopril  5 mg Oral Daily  . sodium chloride flush  3 mL Intravenous Q12H    Assessment: 84 yo female presented with complaints of worsening SOB x 2 weeks. Not on any PTA anticoagulants.  Goal of Therapy:  Heparin level 0.3-0.7 units/ml Monitor platelets by anticoagulation protocol: Yes   Plan:  11/22 @ 0130 HL 0.20 subtherapeutic. Will rebolus w/ heparin 850 units IV x 1 and increase rate to 800 units/hr and will recheck HL @ 1000 will  monitor CBC w/ am labs.  Tobie Lords, PharmD, BCPS Clinical Pharmacist 06/04/2018

## 2018-06-03 NOTE — Progress Notes (Signed)
[facility] NAME: Melinda Little    MR#:  [id number]  DATE OF BIRTH:  02/07/1930  SUBJECTIVE:  CHIEF COMPLAINT:   Chief Complaint  Patient presents with  . Shortness of Breath   Came with SOB, CHF and NSTEMI, s/p cath and also thoracentesis. On bipap and low BP. REVIEW OF SYSTEMS:  CONSTITUTIONAL: No fever, fatigue or weakness.  EYES: No blurred or double vision.  EARS, NOSE, AND THROAT: No tinnitus or ear pain.  RESPIRATORY: No cough, have shortness of breath,no wheezing or hemoptysis.  CARDIOVASCULAR: No chest pain, orthopnea, edema.  GASTROINTESTINAL: No nausea, vomiting, diarrhea or abdominal pain.  GENITOURINARY: No dysuria, hematuria.  ENDOCRINE: No polyuria, nocturia,  HEMATOLOGY: No anemia, easy bruising or bleeding SKIN: No rash or lesion. MUSCULOSKELETAL: No joint pain or arthritis.   NEUROLOGIC: No tingling, numbness, weakness.  PSYCHIATRY: No anxiety or depression.   ROS  DRUG ALLERGIES:   Allergies  Allergen Reactions  . Ciprofloxacin Other (See Comments)    Hallucinations      VITALS:  Blood pressure 94/61, pulse (!) 103, temperature 98.2 F (36.8 C), temperature source Oral, resp. rate (!) 23, height 5\' 4"  (1.626 m), weight 57.5 kg, SpO2 96 %.  PHYSICAL EXAMINATION:  GENERAL:  84 y.o.-year-old patient lying in the bed with no acute distress.  EYES: Pupils equal, round, reactive to light and accommodation. No scleral icterus. Extraocular muscles intact.  HEENT: Head atraumatic, normocephalic. Oropharynx and nasopharynx clear.  NECK:  Supple, no jugular venous distention. No thyroid enlargement, no tenderness.  LUNGS: Normal breath sounds bilaterally, no wheezing, some crepitation. No use of accessory muscles of respiration. bipap  In use. CARDIOVASCULAR: S1, S2 normal. No murmurs, rubs, or gallops.  ABDOMEN: Soft, nontender, nondistended. Bowel sounds present. No organomegaly or mass.  EXTREMITIES:  No pedal edema, cyanosis, or clubbing.  NEUROLOGIC: Cranial nerves II through XII are intact. Muscle strength 3/5 in all extremities. Sensation intact. Gait not checked.  PSYCHIATRIC: The patient is alert and oriented x 3.  SKIN: No obvious rash, lesion, or ulcer.   Physical Exam LABORATORY PANEL:   CBC Recent Labs  Lab 06/01/2018 0419  WBC 11.4*  HGB 11.1*  HCT 33.1*  PLT 216   ------------------------------------------------------------------------------------------------------------------  Chemistries  Recent Labs  Lab 06/07/2018 0419 06/02/2018 1619  NA 127*  --   K 4.5  --   CL 100  --   CO2 20*  --   GLUCOSE 135*  --   BUN 26*  --   CREATININE 0.75  --   CALCIUM 8.5*  --   MG  --  2.1   ------------------------------------------------------------------------------------------------------------------  Cardiac Enzymes Recent Labs  Lab 05/29/2018 0419 05/30/2018 1125  TROPONINI 20.63* 10.77*   ------------------------------------------------------------------------------------------------------------------  RADIOLOGY:  Dg Chest 2 View  Result Date: 05/15/2018 CLINICAL DATA:  Shortness of breath. EXAM: CHEST - 2 VIEW COMPARISON:  05/21/2016 FINDINGS: There is a moderate to large left pleural effusion and a small to moderate right pleural effusion. Moderate diffuse pulmonary edema is identified bilaterally. Background chronic lung disease is identified, likely emphysema. The cardiac silhouette is obscured by left pleural effusion. IMPRESSION: 1. Suspect moderate congestive heart failure. 2.  Emphysema (ICD10-J43.9). Electronically Signed   By: Kerby Moors M.D.   On: 06/06/2018 16:12   Dg Chest Port 1 View  Result Date: 06/10/2018 CLINICAL DATA:  Status post left-sided thoracentesis. EXAM: PORTABLE CHEST 1 VIEW COMPARISON:  05/26/2018 FINDINGS: Interval near complete evacuation of  the left pleural effusion. A small pleural effusion persists. No postprocedural  pneumothorax is identified. Persistent small right pleural effusion. Severe chronic lung disease. IMPRESSION: Near complete evacuation of the left-sided pleural effusion without postprocedural pneumothorax. Electronically Signed   By: Marijo Sanes M.D.   On: 05/27/2018 12:20   US Thoracentesis Asp Pleural Space W/img Guide  Result Date: 06/10/2018 INDICATION: 84 year old with shortness of breath and left pleural effusion. EXAM: ULTRASOUND GUIDED LEFT THORACENTESIS MEDICATIONS: None. COMPLICATIONS: None immediate. PROCEDURE: An ultrasound guided thoracentesis was thoroughly discussed with the patient and questions answered. The benefits, risks, alternatives and complications were also discussed. The patient understands and wishes to proceed with the procedure. Written consent was obtained. Ultrasound was performed to localize and mark an adequate pocket of fluid in the left chest. The area was then prepped and draped in the normal sterile fashion. 1% Lidocaine was used for local anesthesia. Under ultrasound guidance a 6 Fr Safe-T-Centesis catheter was introduced. Thoracentesis was performed. The catheter was removed and a dressing applied. FINDINGS: A total of approximately 800 mL of dark yellow fluid was removed. Samples were sent to the laboratory as requested by the clinical team. IMPRESSION: Successful ultrasound guided left thoracentesis yielding 800 mL of pleural fluid. Electronically Signed   By: Markus Daft M.D.   On: 06/01/2018 13:03    ASSESSMENT AND PLAN:   Active Problems:   NSTEMI (non-ST elevated myocardial infarction) ([place])   Acute pulmonary edema (HCC)   Malnutrition of moderate degree  84 year old female with history of breast cancer who presents to the ER due to shortness of breath.  *  Acute hypoxic respiratory failure in the setting of pulmonary edema seen on chest x-ray Ac systolic CHF Patient currently on BiPAP and will be weaned as tolerated Case discussed with intensivist    *  New onset pulmonary edema with PND, orthopnea and lower extremity edema and large left pleural effusion: Continue IV Lasix Monitor intake and output with daily weight  checked echocardiogram [facility] Cardiology consult placed via epic s/p thoracentesis with labs  *  Hyponatremia: Check TSH Continue to monitor closely as patient will be on Lasix Suspect this could be due to underlying pulmonary edema  *  Non-ST elevation MI: Continue heparin drip and aspirin Checked lipid panel- LDL 83, and A1c- 5.4 Due to low BP, I will not start beta-blocker at this time however when she is euvolemic then beta-blocker may be started. Follow-up on echocardiogram and cardiology recommendations Follow troponins and telemetry monitoring s/p cardiac cath.   Crdio suggest heparine drip for now, and may plan for CABG, once stable.  *  Essential hypertension: Continue lisinopril  *   Prediabetes: Sliding scale initiated      All the records are reviewed and case discussed with Care Management/Social Workerr. Management plans discussed with the patient, family and they are in agreement.  CODE STATUS: full.  TOTAL TIME TAKING CARE OF THIS PATIENT: *35 minutes.     POSSIBLE D/C IN *1-2 DAYS, DEPENDING ON CLINICAL CONDITION.   Vaughan Basta M.D on 05/25/2018   Between 7am to 6pm - Pager - [phone]  After 6pm go to [email or web address] - password [email or web address] [facility]  Office  [phone]  CC: Primary care physician; Dion Body, MD  Note: This dictation was prepared with Dragon dictation along with smaller phrase technology. Any transcriptional errors that result from this process are unintentional.

## 2018-06-03 NOTE — Progress Notes (Signed)
Pt. Arrived from cath lab c/o severe SOB on NRB mask. Respiratory therapy called to put patient back on BI-PAP. Dr. Nehemiah Massed at bedside with pt. ECHO being set up now at bedside per MD request. Pt. Very dyspneic; coached with breathing.

## 2018-06-03 NOTE — Progress Notes (Signed)
[facility] Progress Note Patient Name: Melinda Little DOB: 1930/05/13 MRN: [id number]   Date of Service  05/21/2018  HPI/Events of Note  Pt is 84 yo F admitted with pulmonary edema, acute hypoxic respiratory failure on Bipap and NSTEMI.   eICU Interventions  ICU admit, Echo, Lasix, ASA, Bipap, Heparin gtt, cardiology consult. Pt is DNR.        [name] 06/02/2018, 12:10 AM

## 2018-06-03 NOTE — Progress Notes (Signed)
Initial Nutrition Assessment  DOCUMENTATION CODES:   Non-severe (moderate) malnutrition in context of chronic illness  INTERVENTION:   Ensure Enlive po BID, each supplement provides 350 kcal and 20 grams of protein  MVI daily  Magic cup TID with meals, each supplement provides 290 kcal and 9 grams of protein  Recommend liberalize diet   NUTRITION DIAGNOSIS:   Moderate Malnutrition related to chronic illness(h/o breast cancer, CHF, advanced age ) as evidenced by moderate to severe fat depletions, moderate to severe muscle depletions.  GOAL:   Patient will meet greater than or equal to 90% of their needs  MONITOR:   PO intake, Supplement acceptance, Labs, Weight trends, Skin, I & O's  REASON FOR ASSESSMENT:   Malnutrition Screening Tool    ASSESSMENT:   84 y.o. female with significant progression of shortness of breath and congestive heart failure with chest pressure yesterday having EKG changes and troponin elevation consistent with anterior apical myocardial infarction. Pt with bilateral pleural effusions and possible PNA  Pt s/p thoracentesis with 861m output 11/22  Met with pt in room today. RD obtained only brief history as pt with Bipap on and having SOB. Pt reports a gradual decrease in her appetite over the past few years but reports poor oral intake for the past week. Pt also reports a gradual decline in her weight over the past few years; pt reports she used to weigh 140lbs. Pt reports that she does have Ensure at home but has not yet started drinking it. Per chart, pt appears fairly weight stable over the past year. Pt is willing to drink Ensure when she is able. RD will order supplements and MVI. Recommend liberal diet as pt will not likely eat enough to exceed nutrient limits. Suspect pt at refeeding risk; recommend monitor K, Mg and P labs once oral intake improves.   Medications reviewed and include: aspirin, lasix, insulin, albumin, azithromycin, ceftriaxone,  heparin, neosyneprhine  Labs reviewed: Na 127(L), BUN 26(H) BNP 3266(H)  NUTRITION - FOCUSED PHYSICAL EXAM:    Most Recent Value  Orbital Region  Mild depletion  Upper Arm Region  Moderate depletion  Thoracic and Lumbar Region  Mild depletion  Buccal Region  Mild depletion  Temple Region  Moderate depletion  Clavicle Bone Region  Severe depletion  Clavicle and Acromion Bone Region  Severe depletion  Scapular Bone Region  Moderate depletion  Dorsal Hand  Severe depletion  Patellar Region  Mild depletion  Anterior Thigh Region  No depletion  Posterior Calf Region  No depletion  Edema (RD Assessment)  None  Hair  Reviewed  Eyes  Reviewed  Mouth  Reviewed  Skin  Reviewed  Nails  Reviewed     Diet Order:   Diet Order            Diet Heart Room service appropriate? Yes; Fluid consistency: Thin  Diet effective now             EDUCATION NEEDS:   Education needs have been addressed  Skin:  Skin Assessment: Reviewed RN Assessment  Last BM:  pta  Height:   Ht Readings from Last 1 Encounters:  05/17/2018 _0  (1.626 m)    Weight:   Wt Readings from Last 1 Encounters:  05/31/2018 57.5 kg    Ideal Body Weight:  54.5 kg  BMI:  Body mass index is 21.76 kg/m.  Estimated Nutritional Needs:   Kcal:  1300-1500kcal/day   Protein:  75-85g/day   Fluid:  1.3L/day   CMyriam Jacobson  Megan Salon MS, RD, LDN Pager #- [phone] Office#- [phone] After Hours Pager: [phone]

## 2018-06-03 NOTE — Procedures (Signed)
US guided left thoracentesis.  Removed 800 ml of yellow fluid.  Minimal blood loss and no immediate complication.

## 2018-06-03 NOTE — Progress Notes (Signed)
Spoke with Dr. Soyla Murphy and made MD aware that blood pressure is continuing to be soft and not maintaining at a good pressure.  MD gave order for neo drip to support blood pressure.

## 2018-06-03 NOTE — Progress Notes (Signed)
ANTICOAGULATION CONSULT NOTE - Initial Consult  Pharmacy Consult for Heparin  Indication: ischemic limb  Allergies  Allergen Reactions  . Ciprofloxacin Other (See Comments)    Hallucinations      Patient Measurements: Height: 5\' 4"  (162.6 cm) Weight: 126 lb 12.2 oz (57.5 kg) IBW/kg (Calculated) : 54.7 Heparin Dosing Weight:   Vital Signs: Temp: 98.2 F (36.8 C) (11/22 2000) Temp Source: Oral (11/22 2000) BP: 94/61 (11/22 2000) Pulse Rate: 103 (11/22 2000)  Labs: Recent Labs    06/01/2018 1523 06/01/2018 1630 06/08/2018 2251 06/08/2018 0128 06/04/2018 0419 06/11/2018 1125  HGB 12.4  --   --   --  11.1*  --   HCT 37.1  --   --   --  33.1*  --   PLT 264  --   --   --  216  --   APTT  --  26  --   --   --   --   LABPROT  --  12.1  --   --   --   --   INR  --  0.90  --   --   --   --   HEPARINUNFRC  --   --   --  0.20*  --   --   CREATININE 0.70  --   --   --  0.75  --   TROPONINI 5.75*  --  20.09*  --  20.63* 10.77*    Estimated Creatinine Clearance: 43.6 mL/min (by C-G formula based on SCr of 0.75 mg/dL).   Medical History: Past Medical History:  Diagnosis Date  . Anemia   . Breast cancer ([place]) 2012   right breast/mammosite/radiation  . Breast cancer, right [facility]) 2012   right breast with lumpectomy and mammosite rad tx  . Dyspnea    DOE  . Heart murmur   . Hypertension   . Pain    BACK/ RECENT STEROID INJECTION  . Pre-diabetes   . Tremor    HEAD    Medications:  Medications Prior to Admission  Medication Sig Dispense Refill Last Dose  . aspirin EC 81 MG tablet Take 81 mg by mouth daily.    05/27/2018 at am  . lisinopril (PRINIVIL,ZESTRIL) 10 MG tablet Take 10 mg by mouth daily with breakfast.    05/27/2018 at am  . traMADol (ULTRAM) 50 MG tablet Take 50 mg by mouth every 8 (eight) hours as needed for moderate pain.  0 unknown at unknown    84 yo female presented with complaints of worsening SOB x 2 weeks. Not on any PTA anticoagulants.  Goal of Therapy:   Heparin level 0.3-0.7 units/ml Monitor platelets by anticoagulation protocol: Yes   Plan:  11/22 @ 0900 Per cardiology note heparin drip should continue after cardiac cath and thoracentesis for further risk reduction in further MI.  Heparin drip to start 4 hours after thoracentesis per protocol. New order for heparin 800 units/hr placed to start 11/22 @ 1530.  HL ordered 8 hours after heparin drip restart.  Continue to monitor CBC w/ am labs per protocol.  11/22:  RN states heparin gtt was turned off for a period of time but was not documented on eMar,  Heparin gtt was running when pt returned to CCU after thoracentesis.   Cam Harnden D 05/27/2018,10:21 PM

## 2018-06-03 NOTE — Progress Notes (Signed)
ECHO completed. Pt. On BIPAP without resp. Distress.  Spoke with Dr. Irene Shipper do Thoracentesis at bedside later today in ICU.

## 2018-06-03 NOTE — Progress Notes (Signed)
*  PRELIMINARY RESULTS* Echocardiogram 2D Echocardiogram has been performed.  Melinda Little 06/04/2018, 9:16 AM

## 2018-06-03 NOTE — Progress Notes (Signed)
Blood pressure stable on neo drip.  Continues to wear BiPAP and less tachypnic.  Denies pain.  Right groin site free of complications with all air out of PAD.  Report given to [facility], RN who is now taking over patient's care.

## 2018-06-04 ENCOUNTER — Inpatient Hospital Stay: Payer: Medicare Other

## 2018-06-04 ENCOUNTER — Other Ambulatory Visit: Payer: Medicare Other

## 2018-06-04 DIAGNOSIS — R0902 Hypoxemia: Secondary | ICD-10-CM

## 2018-06-04 DIAGNOSIS — Z4659 Encounter for fitting and adjustment of other gastrointestinal appliance and device: Secondary | ICD-10-CM

## 2018-06-04 DIAGNOSIS — R778 Other specified abnormalities of plasma proteins: Secondary | ICD-10-CM

## 2018-06-04 DIAGNOSIS — I509 Heart failure, unspecified: Secondary | ICD-10-CM

## 2018-06-04 DIAGNOSIS — R7989 Other specified abnormal findings of blood chemistry: Secondary | ICD-10-CM

## 2018-06-04 DIAGNOSIS — Z978 Presence of other specified devices: Secondary | ICD-10-CM

## 2018-06-04 DIAGNOSIS — I5040 Unspecified combined systolic (congestive) and diastolic (congestive) heart failure: Secondary | ICD-10-CM

## 2018-06-04 LAB — PROTEIN, BODY FLUID (OTHER): Total Protein, Body Fluid Other: 1.7 g/dL

## 2018-06-04 LAB — CBC
HEMATOCRIT: 32.5 % — AB (ref 36.0–46.0)
HEMOGLOBIN: 10.6 g/dL — AB (ref 12.0–15.0)
MCH: 30.6 pg (ref 26.0–34.0)
MCHC: 32.6 g/dL (ref 30.0–36.0)
MCV: 93.9 fL (ref 80.0–100.0)
NRBC: 0 % (ref 0.0–0.2)
Platelets: 227 10*3/uL (ref 150–400)
RBC: 3.46 MIL/uL — ABNORMAL LOW (ref 3.87–5.11)
RDW: 13.6 % (ref 11.5–15.5)
WBC: 14.6 10*3/uL — ABNORMAL HIGH (ref 4.0–10.5)

## 2018-06-04 LAB — HEPARIN LEVEL (UNFRACTIONATED)
HEPARIN UNFRACTIONATED: 0.54 [IU]/mL (ref 0.30–0.70)
Heparin Unfractionated: 0.18 IU/mL — ABNORMAL LOW (ref 0.30–0.70)
Heparin Unfractionated: 0.39 IU/mL (ref 0.30–0.70)

## 2018-06-04 LAB — BLOOD GAS, ARTERIAL
Acid-base deficit: 10 mmol/L — ABNORMAL HIGH (ref 0.0–2.0)
Acid-base deficit: 2.7 mmol/L — ABNORMAL HIGH (ref 0.0–2.0)
Bicarbonate: 15.3 mmol/L — ABNORMAL LOW (ref 20.0–28.0)
Bicarbonate: 23.7 mmol/L (ref 20.0–28.0)
Delivery systems: POSITIVE
EXPIRATORY PAP: 5
FIO2: 0.6
FIO2: 1
INSPIRATORY PAP: 12
MECHANICAL RATE: 14
MECHVT: 400 mL
Mechanical Rate: 8
O2 SAT: 92.1 %
O2 Saturation: 98.7 %
PCO2 ART: 31 mmHg — AB (ref 32.0–48.0)
PEEP: 5 cmH2O
PH ART: 7.3 — AB (ref 7.350–7.450)
PO2 ART: 71 mmHg — AB (ref 83.0–108.0)
PO2 ART: 132 mmHg — AB (ref 83.0–108.0)
Patient temperature: 37
Patient temperature: 37
pCO2 arterial: 47 mmHg (ref 32.0–48.0)
pH, Arterial: 7.31 — ABNORMAL LOW (ref 7.350–7.450)

## 2018-06-04 LAB — COMPREHENSIVE METABOLIC PANEL
ALT: 109 U/L — ABNORMAL HIGH (ref 0–44)
AST: 168 U/L — AB (ref 15–41)
Albumin: 3.8 g/dL (ref 3.5–5.0)
Alkaline Phosphatase: 67 U/L (ref 38–126)
Anion gap: 10 (ref 5–15)
BUN: 34 mg/dL — AB (ref 8–23)
CHLORIDE: 102 mmol/L (ref 98–111)
CO2: 25 mmol/L (ref 22–32)
CREATININE: 0.76 mg/dL (ref 0.44–1.00)
Calcium: 7.8 mg/dL — ABNORMAL LOW (ref 8.9–10.3)
Glucose, Bld: 182 mg/dL — ABNORMAL HIGH (ref 70–99)
POTASSIUM: 3.4 mmol/L — AB (ref 3.5–5.1)
SODIUM: 137 mmol/L (ref 135–145)
Total Bilirubin: 0.7 mg/dL (ref 0.3–1.2)
Total Protein: 5.9 g/dL — ABNORMAL LOW (ref 6.5–8.1)

## 2018-06-04 LAB — GLUCOSE, CAPILLARY
GLUCOSE-CAPILLARY: 149 mg/dL — AB (ref 70–99)
Glucose-Capillary: 169 mg/dL — ABNORMAL HIGH (ref 70–99)
Glucose-Capillary: 120 mg/dL — ABNORMAL HIGH (ref 70–99)
Glucose-Capillary: 205 mg/dL — ABNORMAL HIGH (ref 70–99)

## 2018-06-04 LAB — CALCIUM, IONIZED: Calcium, Ionized, Serum: 4.7 mg/dL (ref 4.5–5.6)

## 2018-06-04 LAB — TROPONIN I
TROPONIN I: 13.57 ng/mL — AB (ref <0.03)
TROPONIN I: 21.84 ng/mL — AB (ref <0.03)
TROPONIN I: 31.47 ng/mL — AB (ref <0.03)
Troponin I: 23.27 ng/mL (ref <0.03)

## 2018-06-04 LAB — ECHOCARDIOGRAM COMPLETE
HEIGHTINCHES: 64 in
WEIGHTICAEL: 2028.23 [oz_av]

## 2018-06-04 LAB — PHOSPHORUS: Phosphorus: 4.2 mg/dL (ref 2.5–4.6)

## 2018-06-04 LAB — MAGNESIUM: MAGNESIUM: 2 mg/dL (ref 1.7–2.4)

## 2018-06-04 LAB — PROCALCITONIN: Procalcitonin: 2.37 ng/mL

## 2018-06-04 LAB — LACTIC ACID, PLASMA: LACTIC ACID, VENOUS: 1.9 mmol/L (ref 0.5–1.9)

## 2018-06-04 MED ORDER — SODIUM CHLORIDE 0.9% FLUSH: 10.0000 mL | INTRAVENOUS | Status: DC | PRN

## 2018-06-04 MED ORDER — MIDAZOLAM HCL 2 MG/2ML IJ SOLN: 1.0000 mg | INTRAMUSCULAR | Status: DC | PRN

## 2018-06-04 MED ORDER — HYDROCORTISONE NA SUCCINATE PF 100 MG IJ SOLR
50.0000 mg | Freq: Four times a day (QID) | INTRAMUSCULAR | Status: DC
  Administered 2018-06-04 (×2): 50 mg via INTRAVENOUS
  Filled 2018-06-04 (×2): qty 2

## 2018-06-04 MED ORDER — FENTANYL CITRATE (PF) 100 MCG/2ML IJ SOLN
50.0000 ug | Freq: Once | INTRAMUSCULAR | Status: AC
  Administered 2018-06-04: 50 ug via INTRAVENOUS
  Filled 2018-06-04: qty 2

## 2018-06-04 MED ORDER — FENTANYL CITRATE (PF) 100 MCG/2ML IJ SOLN
100.0000 ug | Freq: Once | INTRAMUSCULAR | Status: AC
  Administered 2018-06-04: 100 ug via INTRAVENOUS

## 2018-06-04 MED ORDER — BISACODYL 10 MG RE SUPP: 10.0000 mg | Freq: Every day | RECTAL | Status: DC | PRN

## 2018-06-04 MED ORDER — FENTANYL CITRATE (PF) 100 MCG/2ML IJ SOLN
INTRAMUSCULAR | Status: AC
  Administered 2018-06-04: 100 ug via INTRAVENOUS
  Filled 2018-06-04: qty 2

## 2018-06-04 MED ORDER — SODIUM BICARBONATE 8.4 % IV SOLN
150.0000 meq | Freq: Once | INTRAVENOUS | Status: AC
  Administered 2018-06-04: 150 meq via INTRAVENOUS
  Filled 2018-06-04: qty 150

## 2018-06-04 MED ORDER — BIOTENE DRY MOUTH MT LIQD: 15.0000 mL | OROMUCOSAL | Status: DC | PRN

## 2018-06-04 MED ORDER — POLYVINYL ALCOHOL 1.4 % OP SOLN
1.0000 [drp] | Freq: Four times a day (QID) | OPHTHALMIC | Status: DC | PRN
  Filled 2018-06-04: qty 15

## 2018-06-04 MED ORDER — MIDAZOLAM HCL 2 MG/2ML IJ SOLN
4.0000 mg | Freq: Once | INTRAMUSCULAR | Status: AC
  Administered 2018-06-04: 4 mg via INTRAVENOUS

## 2018-06-04 MED ORDER — GLYCOPYRROLATE 1 MG PO TABS: 1.0000 mg | ORAL_TABLET | ORAL | Status: DC | PRN

## 2018-06-04 MED ORDER — HEPARIN BOLUS VIA INFUSION
1700.0000 [IU] | Freq: Once | INTRAVENOUS | Status: AC
  Administered 2018-06-04: 1700 [IU] via INTRAVENOUS
  Filled 2018-06-04: qty 1700

## 2018-06-04 MED ORDER — POTASSIUM CHLORIDE 10 MEQ/50ML IV SOLN
10.0000 meq | INTRAVENOUS | Status: AC
  Administered 2018-06-04 (×2): 10 meq via INTRAVENOUS
  Filled 2018-06-04 (×2): qty 50

## 2018-06-04 MED ORDER — VITAL HIGH PROTEIN PO LIQD
1000.0000 mL | ORAL | Status: DC
  Administered 2018-06-04: 1000 mL

## 2018-06-04 MED ORDER — CHLORHEXIDINE GLUCONATE 0.12% ORAL RINSE (MEDLINE KIT)
15.0000 mL | Freq: Two times a day (BID) | OROMUCOSAL | Status: DC
  Administered 2018-06-04 (×2): 15 mL via OROMUCOSAL

## 2018-06-04 MED ORDER — SODIUM CHLORIDE 0.9% FLUSH
10.0000 mL | Freq: Two times a day (BID) | INTRAVENOUS | Status: DC
  Administered 2018-06-04: 10 mL

## 2018-06-04 MED ORDER — PRO-STAT SUGAR FREE PO LIQD
30.0000 mL | Freq: Two times a day (BID) | ORAL | Status: DC
  Administered 2018-06-04: 30 mL

## 2018-06-04 MED ORDER — FENTANYL 2500MCG IN NS 250ML (10MCG/ML) PREMIX INFUSION
25.0000 ug/h | INTRAVENOUS | Status: DC
  Administered 2018-06-04: 50 ug/h via INTRAVENOUS
  Administered 2018-06-04: 100 ug/h via INTRAVENOUS
  Administered 2018-06-04: 50 ug/h via INTRAVENOUS
  Filled 2018-06-04: qty 250

## 2018-06-04 MED ORDER — MORPHINE 100MG IN NS 100ML (1MG/ML) PREMIX INFUSION
1.0000 mg/h | INTRAVENOUS | Status: DC
  Administered 2018-06-05: 4 mg/h via INTRAVENOUS
  Filled 2018-06-04: qty 100

## 2018-06-04 MED ORDER — GLYCOPYRROLATE 0.2 MG/ML IJ SOLN: 0.2000 mg | INTRAMUSCULAR | Status: DC | PRN

## 2018-06-04 MED ORDER — IPRATROPIUM-ALBUTEROL 0.5-2.5 (3) MG/3ML IN SOLN
3.0000 mL | Freq: Four times a day (QID) | RESPIRATORY_TRACT | Status: DC
  Administered 2018-06-04 (×2): 3 mL via RESPIRATORY_TRACT
  Filled 2018-06-04 (×3): qty 3

## 2018-06-04 MED ORDER — SODIUM CHLORIDE 0.9 % IV SOLN
0.0000 ug/min | INTRAVENOUS | Status: DC
  Administered 2018-06-04 (×4): 400 ug/min via INTRAVENOUS
  Administered 2018-06-04: 250 ug/min via INTRAVENOUS
  Administered 2018-06-04: 300 ug/min via INTRAVENOUS
  Administered 2018-06-04: 400 ug/min via INTRAVENOUS
  Administered 2018-06-04: 225 ug/min via INTRAVENOUS
  Administered 2018-06-04: 200 ug/min via INTRAVENOUS
  Administered 2018-06-04: 175 ug/min via INTRAVENOUS
  Administered 2018-06-04: 400 ug/min via INTRAVENOUS
  Filled 2018-06-04 (×2): qty 4
  Filled 2018-06-04: qty 40
  Filled 2018-06-04: qty 4
  Filled 2018-06-04 (×5): qty 40

## 2018-06-04 MED ORDER — SENNOSIDES 8.8 MG/5ML PO SYRP
5.0000 mL | ORAL_SOLUTION | Freq: Two times a day (BID) | ORAL | Status: DC | PRN
  Filled 2018-06-04: qty 5

## 2018-06-04 MED ORDER — STERILE WATER FOR INJECTION IV SOLN
INTRAVENOUS | Status: DC
  Administered 2018-06-04: 06:00:00 via INTRAVENOUS
  Filled 2018-06-04 (×2): qty 850

## 2018-06-04 MED ORDER — DOBUTAMINE IN D5W 4-5 MG/ML-% IV SOLN
5.0000 ug/kg/min | INTRAVENOUS | Status: DC
  Administered 2018-06-04: 5 ug/kg/min via INTRAVENOUS
  Filled 2018-06-04: qty 250

## 2018-06-04 MED ORDER — MIDAZOLAM HCL 2 MG/2ML IJ SOLN
1.0000 mg | INTRAMUSCULAR | Status: DC | PRN
  Administered 2018-06-04 (×2): 1 mg via INTRAVENOUS
  Filled 2018-06-04 (×2): qty 2

## 2018-06-04 MED ORDER — FUROSEMIDE 10 MG/ML IJ SOLN
20.0000 mg | Freq: Once | INTRAMUSCULAR | Status: AC
  Administered 2018-06-04: 20 mg via INTRAVENOUS
  Filled 2018-06-04: qty 2

## 2018-06-04 MED ORDER — ONDANSETRON HCL 4 MG/2ML IJ SOLN: 4.0000 mg | Freq: Four times a day (QID) | INTRAMUSCULAR | Status: DC | PRN

## 2018-06-04 MED ORDER — ETOMIDATE 2 MG/ML IV SOLN
INTRAVENOUS | Status: AC
  Administered 2018-06-04: 20 mg via INTRAVENOUS
  Filled 2018-06-04: qty 10

## 2018-06-04 MED ORDER — FUROSEMIDE 10 MG/ML IJ SOLN
40.0000 mg | Freq: Once | INTRAMUSCULAR | Status: AC
  Administered 2018-06-04: 40 mg via INTRAVENOUS
  Filled 2018-06-04: qty 4

## 2018-06-04 MED ORDER — VANCOMYCIN HCL IN DEXTROSE 1-5 GM/200ML-% IV SOLN
1000.0000 mg | Freq: Once | INTRAVENOUS | Status: DC
  Filled 2018-06-04: qty 200

## 2018-06-04 MED ORDER — ONDANSETRON 4 MG PO TBDP
4.0000 mg | ORAL_TABLET | Freq: Four times a day (QID) | ORAL | Status: DC | PRN
  Filled 2018-06-04: qty 1

## 2018-06-04 MED ORDER — NOREPINEPHRINE 16 MG/250ML-% IV SOLN
0.0000 ug/min | INTRAVENOUS | Status: DC
  Administered 2018-06-04: 20 ug/min via INTRAVENOUS
  Administered 2018-06-04 (×2): 40 ug/min via INTRAVENOUS
  Filled 2018-06-04 (×4): qty 250

## 2018-06-04 MED ORDER — VANCOMYCIN HCL IN DEXTROSE 750-5 MG/150ML-% IV SOLN
750.0000 mg | INTRAVENOUS | Status: DC
  Administered 2018-06-04: 750 mg via INTRAVENOUS
  Filled 2018-06-04: qty 150

## 2018-06-04 MED ORDER — ACETAMINOPHEN 650 MG RE SUPP: 650.0000 mg | Freq: Four times a day (QID) | RECTAL | Status: DC | PRN

## 2018-06-04 MED ORDER — PANTOPRAZOLE SODIUM 40 MG IV SOLR
40.0000 mg | Freq: Every day | INTRAVENOUS | Status: DC
  Administered 2018-06-04: 40 mg via INTRAVENOUS
  Filled 2018-06-04: qty 40

## 2018-06-04 MED ORDER — MORPHINE BOLUS VIA INFUSION
2.0000 mg | INTRAVENOUS | Status: DC | PRN
  Administered 2018-06-05: 2 mg via INTRAVENOUS
  Filled 2018-06-04: qty 4

## 2018-06-04 MED ORDER — MIDAZOLAM HCL 2 MG/2ML IJ SOLN
INTRAMUSCULAR | Status: AC
  Administered 2018-06-04: 4 mg via INTRAVENOUS
  Filled 2018-06-04: qty 4

## 2018-06-04 MED ORDER — FENTANYL BOLUS VIA INFUSION
25.0000 ug | INTRAVENOUS | Status: DC | PRN
  Filled 2018-06-04: qty 25

## 2018-06-04 MED ORDER — ACETAMINOPHEN 325 MG PO TABS: 650.0000 mg | ORAL_TABLET | Freq: Four times a day (QID) | ORAL | Status: DC | PRN

## 2018-06-04 MED ORDER — ETOMIDATE 2 MG/ML IV SOLN
20.0000 mg | Freq: Once | INTRAVENOUS | Status: AC
  Administered 2018-06-04: 20 mg via INTRAVENOUS

## 2018-06-04 MED ORDER — ORAL CARE MOUTH RINSE
15.0000 mL | OROMUCOSAL | Status: DC
  Administered 2018-06-04 (×5): 15 mL via OROMUCOSAL

## 2018-06-04 NOTE — Consult Note (Signed)
ANTICOAGULATION CONSULT NOTE - Initial Consult  Pharmacy Consult for Heparin Indication: chest pain/ACS  Allergies  Allergen Reactions  . Ciprofloxacin Other (See Comments)    Hallucinations      Patient Measurements: Height: '5\' 4"'  (162.6 cm) Weight: 123 lb 14.4 oz (56.2 kg) IBW/kg (Calculated) : 54.7 Heparin Dosing Weight: 57.2 kg  Vital Signs: Temp: 100.2 F (37.9 C) (11/23 0800) Temp Source: Oral (11/23 0200) BP: 97/63 (11/23 0800) Pulse Rate: 101 (11/23 0800)  Labs: Recent Labs    05/19/2018 1523 05/31/2018 1630  05/13/2018 0128 06/08/2018 0419 05/24/2018 1125 05/15/2018 2332 06/04/18 0520 06/04/18 0909  HGB 12.4  --   --   --  11.1*  --   --  10.6*  --   HCT 37.1  --   --   --  33.1*  --   --  32.5*  --   PLT 264  --   --   --  216  --   --  227  --   APTT  --  26  --   --   --   --   --   --   --   LABPROT  --  12.1  --   --   --   --   --   --   --   INR  --  0.90  --   --   --   --   --   --   --   HEPARINUNFRC  --   --   --  0.20*  --   --  0.18*  --  0.54  CREATININE 0.70  --   --   --  0.75  --   --  0.76  --   TROPONINI 5.75*  --    < >  --  20.63* 10.77*  --  13.57*  --    < > = values in this interval not displayed.    Estimated Creatinine Clearance: 43.6 mL/min (by C-G formula based on SCr of 0.76 mg/dL).   Medical History: Past Medical History:  Diagnosis Date  . Anemia   . Breast cancer ([place]) 2012   right breast/mammosite/radiation  . Breast cancer, right [facility]) 2012   right breast with lumpectomy and mammosite rad tx  . Dyspnea    DOE  . Heart murmur   . Hypertension   . Pain    BACK/ RECENT STEROID INJECTION  . Pre-diabetes   . Tremor    HEAD    Medications:  Scheduled:  . aspirin  81 mg Oral Daily  . atorvastatin  10 mg Oral q1800  . chlorhexidine gluconate (MEDLINE KIT)  15 mL Mouth Rinse BID  . feeding supplement (ENSURE ENLIVE)  237 mL Oral BID BM  . furosemide  20 mg Intravenous BID  . insulin aspart  0-9 Units Subcutaneous TID  WC  . ipratropium-albuterol  3 mL Nebulization Q6H  . lisinopril  5 mg Oral Daily  . mouth rinse  15 mL Mouth Rinse 10 times per day  . multivitamin with minerals  1 tablet Oral Daily  . pantoprazole (PROTONIX) IV  40 mg Intravenous Daily  . sodium chloride flush  10-40 mL Intracatheter Q12H  . sodium chloride flush  3 mL Intravenous Q12H    Assessment: 84 yo female presented with complaints of worsening SOB x 2 weeks. Not on any PTA anticoagulants. Hgb continues to trend down, will f/u and monitor.  Heparin Course 11/22 initiation: 3400 unit  bolus, then 650 units/hr 11/22 0130 HL 0.20 850 unit bolus, then inc to 800 units/hr 11/22 drip paused for thoracentesis 11/22 2330 HL 0.18: 1700 unit bolus, then inc to 950 units/hr 11/23 0909 HL 0.54  Goal of Therapy:  Heparin level 0.3-0.7 units/ml Monitor platelets by anticoagulation protocol: Yes   Plan:  Maintain current rate of 950 units/hr and check heparin level at 1700 on 11/23.  Pharmacy will continue to monitor.  Dallie Piles, PharmD Clinical Pharmacist 06/04/2018 9:37 AM

## 2018-06-04 NOTE — Progress Notes (Signed)
Patient is resting comfortably at this time, family at bedside. BP MAP 70s on 2 pressors. Little urine output. Family updated several times by ICU MD and Dr Nehemiah Massed, family did not want second set of [facility] done. Earrings, small round pearl colored studs asked for and given by RN to Patrici Ranks( daughter in Sports [employer])  All questions asked as asked

## 2018-06-04 NOTE — Progress Notes (Signed)
Long discussion with the family about the current condition of the patient.  The patient has had severe aortic valve stenosis which appears clinically to have been going on for years but had symptoms throughout this summer and critical congestive heart failure type symptoms over the last several weeks.  She had significant pleural effusions and heart failure with weakness fatigue and shortness of breath.  She was admitted to the hospital after having an acute anterior lateral myocardial infarction which exacerbated her heart failure.  This was appropriately treated medically manage with patent left anterior descending artery and an echo showing severe LV systolic dysfunction with ejection fraction of 20%.  The patient has had deterioration with worsening pulmonary edema despite appropriate medication management and hypotension with maximal pressures at this time.  With this the patient was intubated but still has had no improvements in current condition.  After long discussion with the family that patient has a very poor prognosis under these conditions the patient family is considered possible palliative care.  There are no other interventions at this time that would improve her outcomes to be on medication management that has been continued.  The patient's family will discuss with critical care physician about further treatment options and/or palliative care

## 2018-06-04 NOTE — Consult Note (Signed)
ANTICOAGULATION CONSULT NOTE - Initial Consult  Pharmacy Consult for Heparin Indication: chest pain/ACS  Allergies  Allergen Reactions  . Ciprofloxacin Other (See Comments)    Hallucinations      Patient Measurements: Height: '5\' 4"'  (162.6 cm) Weight: 123 lb 14.4 oz (56.2 kg) IBW/kg (Calculated) : 54.7 Heparin Dosing Weight: 57.2 kg  Vital Signs: Temp: 97.7 F (36.5 C) (11/23 1500) BP: 80/52 (11/23 1645) Pulse Rate: 108 (11/23 1645)  Labs: Recent Labs    05/17/2018 1523 06/06/2018 1630  06/06/2018 0419 06/04/2018 1125 05/29/2018 2332 06/04/18 0520 06/04/18 0909 06/04/18 1653  HGB 12.4  --   --  11.1*  --   --  10.6*  --   --   HCT 37.1  --   --  33.1*  --   --  32.5*  --   --   PLT 264  --   --  216  --   --  227  --   --   APTT  --  26  --   --   --   --   --   --   --   LABPROT  --  12.1  --   --   --   --   --   --   --   INR  --  0.90  --   --   --   --   --   --   --   HEPARINUNFRC  --   --    < >  --   --  0.18*  --  0.54 0.39  CREATININE 0.70  --   --  0.75  --   --  0.76  --   --   TROPONINI 5.75*  --    < > 20.63* 10.77*  --  13.57* 21.84*  --    < > = values in this interval not displayed.    Estimated Creatinine Clearance: 43.6 mL/min (by C-G formula based on SCr of 0.76 mg/dL).   Medical History: Past Medical History:  Diagnosis Date  . Anemia   . Breast cancer ([place]) 2012   right breast/mammosite/radiation  . Breast cancer, right [facility]) 2012   right breast with lumpectomy and mammosite rad tx  . Dyspnea    DOE  . Heart murmur   . Hypertension   . Pain    BACK/ RECENT STEROID INJECTION  . Pre-diabetes   . Tremor    HEAD    Medications:  Scheduled:  . aspirin  81 mg Oral Daily  . atorvastatin  10 mg Oral q1800  . chlorhexidine gluconate (MEDLINE KIT)  15 mL Mouth Rinse BID  . feeding supplement (ENSURE ENLIVE)  237 mL Oral BID BM  . feeding supplement (PRO-STAT SUGAR FREE 64)  30 mL Per Tube BID  . feeding supplement (VITAL HIGH PROTEIN)   1,000 mL Per Tube Q24H  . furosemide  20 mg Intravenous BID  . hydrocortisone sod succinate (SOLU-CORTEF) inj  50 mg Intravenous Q6H  . insulin aspart  0-9 Units Subcutaneous TID WC  . ipratropium-albuterol  3 mL Nebulization Q6H  . lisinopril  5 mg Oral Daily  . mouth rinse  15 mL Mouth Rinse 10 times per day  . multivitamin with minerals  1 tablet Oral Daily  . pantoprazole (PROTONIX) IV  40 mg Intravenous Daily  . sodium chloride flush  10-40 mL Intracatheter Q12H  . sodium chloride flush  3 mL Intravenous Q12H    Assessment:  84 yo female presented with complaints of worsening SOB x 2 weeks. Not on any PTA anticoagulants. Hgb continues to trend down, will f/u and monitor.  Heparin Course 11/22 initiation: 3400 unit bolus, then 650 units/hr 11/22 0130 HL 0.20 850 unit bolus, then inc to 800 units/hr 11/22 drip paused for thoracentesis 11/22 2330 HL 0.18: 1700 unit bolus, then inc to 950 units/hr 11/23 0909 HL 0.54 11/23 1700 HL 0.39  Goal of Therapy:  Heparin level 0.3-0.7 units/ml Monitor platelets by anticoagulation protocol: Yes   Plan:  Maintain current rate of 950 units/hr and check heparin level 11/24 at 0100.  Pharmacy will continue to monitor.  Forrest Moron, PharmD Clinical Pharmacist 06/04/2018 5:17 PM

## 2018-06-04 NOTE — Procedures (Signed)
Intubation Procedure Note Melinda Little [id number] [birth year]  Procedure: Intubation Indications: Respiratory insufficiency  Procedure Details Consent: Risks of procedure as well as the alternatives and risks of each were explained to the (patient/caregiver).  Consent for procedure obtained. Time Out: Verified patient identification, verified procedure, site/side was marked, verified correct patient position, special equipment/implants available, medications/allergies/relevent history reviewed, required imaging and test results available.  Performed  Maximum sterile technique was used including antiseptics, cap, gloves, gown, hand hygiene, mask and sheet.  MAC and 3    Evaluation Hemodynamic Status: BP stable throughout; O2 sats: stable throughout Patient'Little Current Condition: stable Complications: No apparent complications Patient did tolerate procedure well. Chest X-ray ordered to verify placement.  CXR: tube position acceptable. Size 7 ETT, lip length 24 Procedure performed under direct supervision of Dr.Samaan. Glidoscope utilized for airway visualization and intubation  Melinda Little. [facility] ANP-BC Pulmonary and Critical Care Medicine [facility] Pager [phone] or [phone]  NB: This document was prepared using Dragon voice recognition software and may include unintentional dictation errors.    06/04/2018

## 2018-06-04 NOTE — Procedures (Signed)
Central Venous Catheter Insertion Procedure Note Melinda Little [id number] 10-18-1929  Procedure: Insertion of Central Venous Catheter Indications: Assessment of intravascular volume, Drug and/or fluid administration and Frequent blood sampling  Procedure Details Consent: Risks of procedure as well as the alternatives and risks of each were explained to the (patient/caregiver).  Consent for procedure obtained. Time Out: Verified patient identification, verified procedure, site/side was marked, verified correct patient position, special equipment/implants available, medications/allergies/relevent history reviewed, required imaging and test results available.  Performed  Maximum sterile technique was used including antiseptics, cap, gloves, gown, hand hygiene, mask and sheet. Skin prep: Chlorhexidine; local anesthetic administered A antimicrobial bonded/coated triple lumen catheter was placed in the right internal jugular vein using the Seldinger technique.  Evaluation Blood flow good Complications: No apparent complications Patient did tolerate procedure well. Chest X-ray ordered to verify placement.  CXR: normal.  Procedure performed under direct supervision of Dr.Samaan. Ultrasound utilized for realtime vessel cannulation  Melinda Little S. [facility] ANP-BC Pulmonary and Critical Care Medicine [facility] Pager [phone] or [phone]  NB: This document was prepared using Dragon voice recognition software and may include unintentional dictation errors.    06/04/2018, 7:39 AM

## 2018-06-04 NOTE — Progress Notes (Signed)
   BP continues to trend lower, despite levophed at 40 mcgs, neo at 400 ,and Dobutamine at 5. Family updated several times and MD has talked to them re poor prognosis several times. All questions answered as asked. 50 mls of urine output noted all shift, family and MD aware

## 2018-06-04 NOTE — Progress Notes (Signed)
[facility] Encounter Note  Patient: Melinda Little / Admit Date: 05/20/2018 / Date of Encounter: 06/04/2018, 5:23 AM   Subjective: Patient still having significant amount of respiratory distress with some tachycardia and hypoxia requiring BiPAP. The patient has had significant amount of tiring and new onset of more pulmonary edema likely secondary to acute myocardial infarction which is recovering and continued severe aortic valve stenosis causing cardiac stress and heart failure.  The patient's family has been informed to a great degree about the grave situation of myocardial infarction in the setting of critical aortic valve stenosis and congestive heart failure.  The patient is unable to continue with BiPAP due to failure and worsening pulmonary edema and hypoxia.  After discussion the family has agreed to intubation for further potential improvements.  They are under the understanding as well that this does not change the cardiac condition of aortic stenosis but may give better oxygenation to give the patient better chance of recovering from myocardial infarction.  Also we have discussed that they reserve the right as we move forward to withdraw care if the situation becomes more grave.  Cardiac catheterization showing significant proximal left anterior descending artery stenosis of 90% at small aneurysmal area likely culprit of myocardial infarction and elevation of troponin of 20 Echocardiogram showing apical akinesis and anterior hypokinesis with ejection fraction of 25 to 30% Velocities of aortic valve consistent with severe aortic stenosis at 4.5 m/s  Review of Systems: Positive for: Shortness of breath and significant respiratory distress Negative for: Vision change, hearing change, syncope, dizziness, nausea, vomiting,diarrhea, bloody stool, stomach pain, cough, congestion, diaphoresis, urinary frequency, urinary pain,skin lesions, skin rashes Others previously  listed  Objective: Telemetry: Sinus tachycardia with left ventricular hypertrophy Physical Exam: Blood pressure (!) 51/42, pulse (!) 114, temperature 98.3 F (36.8 C), temperature source Oral, resp. rate (!) 29, height _0  (1.626 m), weight 57.5 kg, SpO2 98 %. Body mass index is 21.76 kg/m. General: Well developed, well nourished, in no acute distress. Head: Normocephalic, atraumatic, sclera non-icteric, no xanthomas, nares are without discharge. Neck: No apparent masses Lungs: Increased respirations with no wheezes, no rhonchi,  rales , some basilar crackles bibasilar decreased breath sounds  Heart: Regular rate and rhythm, normal S1 soft S2, no 3+ aortic murmur, no rub, no gallop, PMI is normal size and placement, carotid upstroke normal without bruit, jugular venous pressure normal Abdomen: Soft, non-tender, non-distended with normoactive bowel sounds. No hepatosplenomegaly. Abdominal aorta is normal size without bruit Extremities: No edema, no clubbing, no cyanosis, no ulcers,  Peripheral: 2+ radial, 2+ femoral, 2+ dorsal pedal pulses Neuro: Alert and oriented. Moves all extremities spontaneously. Psych:  Responds to questions appropriately with a normal affect.   Intake/Output Summary (Last 24 hours) at 06/04/2018 0523 Last data filed at 06/04/2018 0200 Gross per 24 hour  Intake 1788.54 ml  Output 1490 ml  Net 298.54 ml    Inpatient Medications:  . aspirin  81 mg Oral Daily  . atorvastatin  10 mg Oral q1800  . chlorhexidine gluconate (MEDLINE KIT)  15 mL Mouth Rinse BID  . feeding supplement (ENSURE ENLIVE)  237 mL Oral BID BM  . furosemide  20 mg Intravenous BID  . insulin aspart  0-9 Units Subcutaneous TID WC  . ipratropium-albuterol  3 mL Nebulization Q6H  . lisinopril  5 mg Oral Daily  . mouth rinse  15 mL Mouth Rinse 10 times per day  . multivitamin with minerals  1 tablet Oral Daily  .  pantoprazole (PROTONIX) IV  40 mg Intravenous Daily  . sodium chloride flush  3  mL Intravenous Q12H   Infusions:  . sodium chloride    . azithromycin Stopped (06/01/2018 1547)  . cefTRIAXone (ROCEPHIN)  IV Stopped (05/28/2018 1337)  . fentaNYL infusion INTRAVENOUS    . heparin 950 Units/hr (06/04/18 0201)  . phenylephrine (NEO-SYNEPHRINE) Adult infusion    .  sodium bicarbonate (isotonic) infusion in sterile water      Labs: Recent Labs    05/24/2018 1523 05/13/2018 0419 05/19/2018 1619  NA 124* 127*  --   K 4.1 4.5  --   CL 95* 100  --   CO2 17* 20*  --   GLUCOSE 292* 135*  --   BUN 23 26*  --   CREATININE 0.70 0.75  --   CALCIUM 9.0 8.5*  --   MG  --   --  2.1  PHOS  --   --  4.3   No results for input(s): AST, ALT, ALKPHOS, BILITOT, PROT, ALBUMIN in the last 72 hours. Recent Labs    05/18/2018 1523 06/04/2018 0419  WBC 13.1* 11.4*  NEUTROABS 11.3*  --   HGB 12.4 11.1*  HCT 37.1 33.1*  MCV 91.8 91.2  PLT 264 216   Recent Labs    05/26/2018 1523 05/30/2018 2251 05/20/2018 0419 05/15/2018 1125  TROPONINI 5.75* 20.09* 20.63* 10.77*   Invalid input(s): POCBNP Recent Labs    05/26/2018 2251  HGBA1C 5.4     Weights: Filed Weights   05/25/2018 2137 06/09/2018 0339 06/08/2018 0723  Weight: 56.6 kg 57.5 kg 57.5 kg     Radiology/Studies:  Dg Chest 2 View  Result Date: 05/24/2018 CLINICAL DATA:  Shortness of breath. EXAM: CHEST - 2 VIEW COMPARISON:  05/21/2016 FINDINGS: There is a moderate to large left pleural effusion and a small to moderate right pleural effusion. Moderate diffuse pulmonary edema is identified bilaterally. Background chronic lung disease is identified, likely emphysema. The cardiac silhouette is obscured by left pleural effusion. IMPRESSION: 1. Suspect moderate congestive heart failure. 2.  Emphysema (ICD10-J43.9). Electronically Signed   By: Kerby Moors M.D.   On: 06/01/2018 16:12   Dg Chest Port 1 View  Result Date: 06/04/2018 CLINICAL DATA:  Congestive heart failure. Worsening shortness of breath EXAM: PORTABLE CHEST 1 VIEW COMPARISON:   05/23/2018 FINDINGS: Reaccumulating left pleural effusion, intermediate in size. Small right pleural effusion is unchanged. There is moderate pulmonary edema with bibasilar atelectasis. Cardiomegaly is unchanged. No pneumothorax. IMPRESSION: 1. Reaccumulating left pleural effusion, intermediate in size. Unchanged small right pleural effusion. 2. Moderate pulmonary edema with bibasilar atelectasis. Electronically Signed   By: Ulyses Jarred M.D.   On: 06/04/2018 03:15   Dg Chest Port 1 View  Result Date: 05/13/2018 CLINICAL DATA:  Status post left-sided thoracentesis. EXAM: PORTABLE CHEST 1 VIEW COMPARISON:  05/28/2018 FINDINGS: Interval near complete evacuation of the left pleural effusion. A small pleural effusion persists. No postprocedural pneumothorax is identified. Persistent small right pleural effusion. Severe chronic lung disease. IMPRESSION: Near complete evacuation of the left-sided pleural effusion without postprocedural pneumothorax. Electronically Signed   By: Marijo Sanes M.D.   On: 05/22/2018 12:20   US Thoracentesis Asp Pleural Space W/img Guide  Result Date: 06/09/2018 INDICATION: 84 year old with shortness of breath and left pleural effusion. EXAM: ULTRASOUND GUIDED LEFT THORACENTESIS MEDICATIONS: None. COMPLICATIONS: None immediate. PROCEDURE: An ultrasound guided thoracentesis was thoroughly discussed with the patient and questions answered. The benefits, risks, alternatives and complications were also  discussed. The patient understands and wishes to proceed with the procedure. Written consent was obtained. Ultrasound was performed to localize and mark an adequate pocket of fluid in the left chest. The area was then prepped and draped in the normal sterile fashion. 1% Lidocaine was used for local anesthesia. Under ultrasound guidance a 6 Fr Safe-T-Centesis catheter was introduced. Thoracentesis was performed. The catheter was removed and a dressing applied. FINDINGS: A total of  approximately 800 mL of dark yellow fluid was removed. Samples were sent to the laboratory as requested by the clinical team. IMPRESSION: Successful ultrasound guided left thoracentesis yielding 800 mL of pleural fluid. Electronically Signed   By: Markus Daft M.D.   On: 05/19/2018 13:03     Assessment and Recommendation  84 y.o. female with significant progression of shortness of breath and congestive heart failure with chest pressure yesterday having EKG changes and troponin elevation consistent with anterior apical myocardial infarction.  Patient with tenuous respiratory distress and now needing needing intubation and successfully intubated for improved oxygenation after restoration of perfusion to anterior apical wall from myocardial infarction on admission and continued significant aortic stenosis which currently cannot be fixed Cardiac catheterization showing appropriate stenosis of left anterior descending artery at 90% at aneurysmal spot  Echocardiogram showing severe apical and anterior hypokinesis with severe aortic valve stenosis 1.  Successful intubation with continued oxygenation and treatment of congestive heart failure and pulmonary edema  2.  Supportive care for hypotension although likely secondary to critical aortic stenosis 3.  Continuation of heparin and aspirin for further risk reduction of arterial thrombosis and myocardial infarction  4.  Abstain from beta-blocker or other medication management due to hypotension 5.  If patient does recover from myocardial infarction in the setting of severe aortic valve stenosis congestive heart failure but then the patient may be able to have further intervention of PCI and stent placement of left anterior descending artery and subsequent TAVR several months later.  This has been disc asked at length with multiple care team members including interventional cardiology and cardiovascular surgery.  The entire care team is evolved and in agreement with  all of current and above treatment plans  Signed, Serafina Royals M.D. FACC

## 2018-06-04 NOTE — Progress Notes (Signed)
ANTICOAGULATION CONSULT NOTE - Initial Consult  Pharmacy Consult for Heparin  Indication: ischemic limb  Allergies  Allergen Reactions  . Ciprofloxacin Other (See Comments)    Hallucinations      Patient Measurements: Height: 5\' 4"  (162.6 cm) Weight: 126 lb 12.2 oz (57.5 kg) IBW/kg (Calculated) : 54.7 Heparin Dosing Weight:   Vital Signs: Temp: 98.2 F (36.8 C) (11/22 2000) Temp Source: Oral (11/22 2000) BP: 97/67 (11/23 0015) Pulse Rate: 101 (11/23 0015)  Labs: Recent Labs    05/24/2018 1523 06/01/2018 1630 05/30/2018 2251 05/30/2018 0128 06/02/2018 0419 06/04/2018 1125 05/26/2018 2332  HGB 12.4  --   --   --  11.1*  --   --   HCT 37.1  --   --   --  33.1*  --   --   PLT 264  --   --   --  216  --   --   APTT  --  26  --   --   --   --   --   LABPROT  --  12.1  --   --   --   --   --   INR  --  0.90  --   --   --   --   --   HEPARINUNFRC  --   --   --  0.20*  --   --  0.18*  CREATININE 0.70  --   --   --  0.75  --   --   TROPONINI 5.75*  --  20.09*  --  20.63* 10.77*  --     Estimated Creatinine Clearance: 43.6 mL/min (by C-G formula based on SCr of 0.75 mg/dL).   Medical History: Past Medical History:  Diagnosis Date  . Anemia   . Breast cancer ([place]) 2012   right breast/mammosite/radiation  . Breast cancer, right [facility]) 2012   right breast with lumpectomy and mammosite rad tx  . Dyspnea    DOE  . Heart murmur   . Hypertension   . Pain    BACK/ RECENT STEROID INJECTION  . Pre-diabetes   . Tremor    HEAD    Medications:  Medications Prior to Admission  Medication Sig Dispense Refill Last Dose  . aspirin EC 81 MG tablet Take 81 mg by mouth daily.    05/30/2018 at am  . lisinopril (PRINIVIL,ZESTRIL) 10 MG tablet Take 10 mg by mouth daily with breakfast.    05/30/2018 at am  . traMADol (ULTRAM) 50 MG tablet Take 50 mg by mouth every 8 (eight) hours as needed for moderate pain.  0 unknown at unknown    84 yo female presented with complaints of worsening SOB x  2 weeks. Not on any PTA anticoagulants.  Goal of Therapy:  Heparin level 0.3-0.7 units/ml Monitor platelets by anticoagulation protocol: Yes   Plan:  11/22 2330 HL 0.18 subtherapeutic. Will rebolus w/ heparin 1700 units IV x 1 and increase rate to 950 units/hr and will recheck HL @ 0900. Hgb trending down will continue to monitor.  Tobie Lords, PharmD, BCPS Clinical Pharmacist 06/04/2018

## 2018-06-04 NOTE — Progress Notes (Signed)
Latest troponin level elevated , ICU MD notified, suggested to notify Cardiologist, Dr Nehemiah Massed notified, no new orders noted,   Pt has a large family, all of whom want to visit. RN worked with son  And Mateo Flow and [place] to allow family members to see and interact with pt when being turned and mouth care. Encouraged family members to allow pt to rest in between care as pt becomes easily anxious, and then requires more sedation which decreases BP. So far this shift Fentyl increased from 50-100 mcgs and Neo increased from 200- 250 mcgs. Poor urine output noted

## 2018-06-04 NOTE — Progress Notes (Signed)
Brief Nutrition Note  Consult received for enteral/tube feeding initiation and management.  Adult Enteral Nutrition Protocol initiated. Full assessment to follow.  Admitting Dx: Acute pulmonary edema (HCC) [J81.0] SOB (shortness of breath) [R06.02] Pleural effusion [J90] Hypoxia [R09.02] Elevated troponin [R79.89]  Body mass index is 21.27 kg/m. Pt meets criteria for normal weight range based on current BMI.  Labs:  Recent Labs  Lab 06/09/2018 1523 05/15/2018 0419 05/29/2018 1619 06/04/18 0519 06/04/18 0520  NA 124* 127*  --   --  137  K 4.1 4.5  --   --  3.4*  CL 95* 100  --   --  102  CO2 17* 20*  --   --  25  BUN 23 26*  --   --  34*  CREATININE 0.70 0.75  --   --  0.76  CALCIUM 9.0 8.5*  --   --  7.8*  MG  --   --  2.1 2.0  --   PHOS  --   --  4.3  --  4.2  GLUCOSE 292* 135*  --   --  182*    Jeremey Bascom A. Jimmye Norman, RD, LDN, CDE Pager: [phone] After hours Pager: [phone]

## 2018-06-04 NOTE — Progress Notes (Signed)
BP dropped into 02V systolic, Fentyl decreased to 50 mcgs, Neo increased to 300 then 400 BP unchanged, MD notified and to add levophed. Son updated re worsening condition  And is at bedside

## 2018-06-04 NOTE — Progress Notes (Addendum)
Name: Melinda Little MRN: [id number] DOB: Jul 25, 1929     CONSULTATION DATE: 05/28/2018 Objective & objectives: Had to be intubated overnight because of worsening respiratory status and hypoxemia.  She continued to be hypotensive tachycardic and was started on Neo-Synephrine.  She was started on a bicarbonate solution which had been discontinued.  Oliguric with poor response to diuresis.  CODE STATUS has been changed to full code   PAST MEDICAL HISTORY :   has a past medical history of Anemia, Breast cancer ([place]) (2012), Breast cancer, right ([place]) (2012), Dyspnea, Heart murmur, Hypertension, Pain, Pre-diabetes, and Tremor.  has a past surgical history that includes Breast biopsy (Right, 2012); Breast biopsy (Right, 2012); Breast lumpectomy; Partial hysterectomy (1978); Abdominal hysterectomy; Colon surgery; Total knee arthroplasty (Right, 05/19/2016); Cataract extraction w/PHACO (Right, 03/22/2018); Joint replacement; Cataract extraction w/PHACO (Left, 04/12/2018); and LEFT HEART CATH AND CORONARY ANGIOGRAPHY (N/A, 05/14/2018). Prior to Admission medications   Medication Sig Start Date End Date Taking? Authorizing Provider  aspirin EC 81 MG tablet Take 81 mg by mouth daily.    Yes [provider]  lisinopril (PRINIVIL,ZESTRIL) 10 MG tablet Take 10 mg by mouth daily with breakfast.  02/11/15  Yes [provider]  traMADol (ULTRAM) 50 MG tablet Take 50 mg by mouth every 8 (eight) hours as needed for moderate pain. 02/22/18  Yes [provider]   Allergies  Allergen Reactions  . Ciprofloxacin Other (See Comments)    Hallucinations      FAMILY HISTORY:  family history is not on file. SOCIAL HISTORY:  reports that she has quit smoking. She has never used smokeless tobacco. She reports that she does not drink alcohol or use drugs.  REVIEW OF SYSTEMS:   Unable to obtain due to critical illness   VITAL SIGNS: Temp:  [98.2 F (36.8 C)-100.2 F (37.9 C)] 100.2 F  (37.9 C) (11/23 0800) Pulse Rate:  [38-125] 101 (11/23 0800) Resp:  [13-42] 14 (11/23 0800) BP: (51-118)/(42-89) 97/63 (11/23 0800) SpO2:  [65 %-100 %] 100 % (11/23 0800) FiO2 (%):  [36 %-100 %] 100 % (11/23 0745) Weight:  [56.2 kg] 56.2 kg (11/23 0500)   Physical Examination:  RASS of -2 and following simple commands On vent, no distress, BEAE and bibasilar fine crackles S1 & S2 audible and no murmur Benign abdomen with normal peristalses No leg edema   ASSESSMENT / PLAN:  Acute respiratory failure, intubated on 05/15/2018 because of worsening respiratory status and hypoxemia.  Poor weaning parameters -Monitor ABG, optimize vent settings and continuous vent support  NSTEMI. Proximal LAD 90% stenosis, cardiogenic shock and acute HFrEF echocardiogram 05/25/2018 LVEF 20-25%, hypokinesis anteroseptal wall, anterolateral wall, lateral wall and akinesis of the apex.  *pulmonary edema and Pl. eff. Medical management as per cardiology -heparin gtt + ASA, Off BB because of hypotension requiring titration of pressers  -Optimize Neo-Synephrine + norepinephrine + dobutamine -Plan per cardiology to allow time to recover acute coronary syndrome and then will consider PCI and TAVR.  Pulmonary edema and Bil. Pl eff s/p 800 ml of Lt.thoracentensis.  Fluid culture remains negative -Optimize diuresis to improve lung compliance  Atelectasis and possible pneumonia with infective etiology can not be rule out.  Worsening bilateral airspace disease urinary congestion -Empiric Roceph + Zithro -Monitor CXR + CBC + FIO2   Sepsis with septic shock, procalcitonin 2.37 -Antibiotics ceftriaxone + Vanc + Zithr, monitor cultures, procalcitonin. -Optimize pressors and monitor hemodynamics  Anemia -Keep HB > 8gm/dl.  Acute urine retention.  Foley catheter  in plan.  Full code  DVT & GI prophylaxis. Continue with supportive care Family was updated at the bedside about the patient progress and  poor outcome.  CODE STATUS was readdressed and they are considering DNR Critical care time 45 min

## 2018-06-04 NOTE — Progress Notes (Signed)
[facility] [place] NAME: Ahria Slappey    MR#:  [id number]  DATE OF BIRTH:  02/23/1930  SUBJECTIVE:  CHIEF COMPLAINT:   Chief Complaint  Patient presents with  . Shortness of Breath   Came with SOB, CHF and NSTEMI, s/p cath and also thoracentesis. Intubated, requiring multiple pressors Daughter and daughter-in-law at bedside REVIEW OF SYSTEMS:   ROS Unobtainable as the patient is intubated DRUG ALLERGIES:   Allergies  Allergen Reactions  . Ciprofloxacin Other (See Comments)    Hallucinations      VITALS:  Blood pressure 97/63, pulse (!) 101, temperature 100.2 F (37.9 C), resp. rate 14, height 5\' 4"  (1.626 m), weight 56.2 kg, SpO2 100 %.  PHYSICAL EXAMINATION:  GENERAL:  84 y.o.-year-old patient lying in the bed status post intubation EYES: Pupils equal, round, reactive to light and accommodation. No scleral icterus.  HEENT: Head atraumatic, normocephalic. Oropharynx and nasopharynx clear.  ET tube intact NECK:  Supple, no jugular venous distention. No thyroid enlargement, no tenderness.  LUNGS: Moderate breath sounds bilaterally, no wheezing, some crepitation. No use of accessory muscles of respiration. CARDIOVASCULAR: S1, S2 normal. No murmurs, rubs, or gallops.  ABDOMEN: Soft, nontender, nondistended. Bowel sounds present.  EXTREMITIES: No pedal edema, cyanosis, or clubbing.  NEUROLOGIC: Intubated and sedated PSYCHIATRIC: The patient is sedated  Physical Exam LABORATORY PANEL:   CBC Recent Labs  Lab 06/04/18 0520  WBC 14.6*  HGB 10.6*  HCT 32.5*  PLT 227   ------------------------------------------------------------------------------------------------------------------  Chemistries  Recent Labs  Lab 06/04/18 0519 06/04/18 0520  NA  --  137  K  --  3.4*  CL  --  102  CO2  --  25  GLUCOSE  --  182*  BUN  --  34*  CREATININE  --  0.76  CALCIUM  --  7.8*  MG 2.0  --   AST  --  168*  ALT  --  109*   ALKPHOS  --  67  BILITOT  --  0.7   ------------------------------------------------------------------------------------------------------------------  Cardiac Enzymes Recent Labs  Lab 06/04/18 0520 06/04/18 0909  TROPONINI 13.57* 21.84*   ------------------------------------------------------------------------------------------------------------------  RADIOLOGY:  Dg Chest 2 View  Result Date: 06/04/2018 CLINICAL DATA:  Shortness of breath. EXAM: CHEST - 2 VIEW COMPARISON:  05/21/2016 FINDINGS: There is a moderate to large left pleural effusion and a small to moderate right pleural effusion. Moderate diffuse pulmonary edema is identified bilaterally. Background chronic lung disease is identified, likely emphysema. The cardiac silhouette is obscured by left pleural effusion. IMPRESSION: 1. Suspect moderate congestive heart failure. 2.  Emphysema (ICD10-J43.9). Electronically Signed   By: Kerby Moors M.D.   On: 06/01/2018 16:12   Dg Abd 1 View  Result Date: 06/04/2018 CLINICAL DATA:  NG tube placement EXAM: ABDOMEN - 1 VIEW COMPARISON:  Pelvis 08/25/2015 FINDINGS: Enteric tube tip projects over the left hemipelvis. This likely indicates that the catheter is in the body of a dilated stomach. Degenerative changes in the lumbar spine. Calcium gas in the abdomen. Bilateral pleural effusions. IMPRESSION: Enteric tube tip projects over the left lower quadrant likely indicating placement in the body of the dilated stomach. Electronically Signed   By: Lucienne Capers M.D.   On: 06/04/2018 06:47   Portable Chest X-ray  Result Date: 06/04/2018 CLINICAL DATA:  Central line placement EXAM: PORTABLE CHEST 1 VIEW COMPARISON:  06/04/2018 FINDINGS: Endotracheal tube with tip measuring 2.5 cm above the carina. Enteric tube tip is off  the field of view but below the left hemidiaphragm. Right central venous catheter with tip over the low SVC region. No pneumothorax. Cardiac enlargement. Bilateral  perihilar airspace disease likely representing edema. Bilateral pleural effusions. Calcification of the aorta. IMPRESSION: Appliances appear in satisfactory position. Cardiac enlargement. Bilateral perihilar edema. Electronically Signed   By: Lucienne Capers M.D.   On: 06/04/2018 06:45   Dg Chest Port 1 View  Result Date: 06/04/2018 CLINICAL DATA:  Congestive heart failure. Worsening shortness of breath EXAM: PORTABLE CHEST 1 VIEW COMPARISON:  05/25/2018 FINDINGS: Reaccumulating left pleural effusion, intermediate in size. Small right pleural effusion is unchanged. There is moderate pulmonary edema with bibasilar atelectasis. Cardiomegaly is unchanged. No pneumothorax. IMPRESSION: 1. Reaccumulating left pleural effusion, intermediate in size. Unchanged small right pleural effusion. 2. Moderate pulmonary edema with bibasilar atelectasis. Electronically Signed   By: Ulyses Jarred M.D.   On: 06/04/2018 03:15   Dg Chest Port 1 View  Result Date: 05/26/2018 CLINICAL DATA:  Status post left-sided thoracentesis. EXAM: PORTABLE CHEST 1 VIEW COMPARISON:  06/07/2018 FINDINGS: Interval near complete evacuation of the left pleural effusion. A small pleural effusion persists. No postprocedural pneumothorax is identified. Persistent small right pleural effusion. Severe chronic lung disease. IMPRESSION: Near complete evacuation of the left-sided pleural effusion without postprocedural pneumothorax. Electronically Signed   By: Marijo Sanes M.D.   On: 05/19/2018 12:20   US Thoracentesis Asp Pleural Space W/img Guide  Result Date: 05/15/2018 INDICATION: 84 year old with shortness of breath and left pleural effusion. EXAM: ULTRASOUND GUIDED LEFT THORACENTESIS MEDICATIONS: None. COMPLICATIONS: None immediate. PROCEDURE: An ultrasound guided thoracentesis was thoroughly discussed with the patient and questions answered. The benefits, risks, alternatives and complications were also discussed. The patient understands and  wishes to proceed with the procedure. Written consent was obtained. Ultrasound was performed to localize and mark an adequate pocket of fluid in the left chest. The area was then prepped and draped in the normal sterile fashion. 1% Lidocaine was used for local anesthesia. Under ultrasound guidance a 6 Fr Safe-T-Centesis catheter was introduced. Thoracentesis was performed. The catheter was removed and a dressing applied. FINDINGS: A total of approximately 800 mL of dark yellow fluid was removed. Samples were sent to the laboratory as requested by the clinical team. IMPRESSION: Successful ultrasound guided left thoracentesis yielding 800 mL of pleural fluid. Electronically Signed   By: Markus Daft M.D.   On: 05/21/2018 13:03    ASSESSMENT AND PLAN:   Active Problems:   NSTEMI (non-ST elevated myocardial infarction) (HCC)   Acute pulmonary edema (HCC)   Malnutrition of moderate degree   CHF (congestive heart failure) (HCC)   Elevated troponin   Encounter for nasogastric (NG) tube placement   Endotracheal tube present   Hypoxia  84 year old female with history of breast cancer who presents to the ER due to shortness of breath.  *  Acute hypoxic respiratory failure in the setting of pulmonary edema -intubated on 05/26/2018 Vent management by intensivist  *Sepsis with septic shock procalcitonin 2.37 Pancultures and broad-spectrum IV antibiotics, IV fluids Lactic acid and procalcitonin monitoring On multiple pressors Levophed, Neo-Synephrine and dobutamine Anuric  *  New onset pulmonary edema with PND, orthopnea and lower extremity edema and large left pleural effusion: Monitor intake and output with daily weight  checked echocardiogram [facility] consult placed via epic s/p thoracentesis with labs  *  Hyponatremia: Sodium improved with IV fluids at 137  *  Non-ST elevation MI  Continue heparin drip and aspirin Checked lipid  panel- LDL 83, and A1c- 5.4 Currently patient is in  septic shock due to low BP, not a candidate for beta-blocker or ACE inhibitor at this time  follow-up on echocardiogram and cardiology recommendations Follow troponins and telemetry monitoring s/p cardiac cath.   Crdio suggest heparine drip for now, and may plan for CABG, once stable.  *  Essential hypertension: Currently in septic shock  *   Prediabetes: Sliding scale initiated      All the records are reviewed and case discussed with family daughter and daughter-in-law at bedside and intensivist    Patient is in septic shock with multiorgan failure  High mortality with extremely, extremely poor prognosis management plans discussed with the patient, family and they are in agreement.  CODE STATUS: full.  TOTAL TIME TAKING CARE OF THIS PATIENT: 35 minutes.       Nicholes Mango M.D on 06/04/2018   Between 7am to 6pm - Pager - [phone]  After 6pm go to [email or web address] - password [email or web address] [facility]  Office  [phone]  CC: Primary care physician; Dion Body, MD  Note: This dictation was prepared with Dragon dictation along with smaller phrase technology. Any transcriptional errors that result from this process are unintentional.

## 2018-06-04 NOTE — Consult Note (Signed)
Pharmacy Antibiotic Note  Melinda Little is a 84 y.o. female admitted on 05/13/2018 with NSTEMI.  She had to be intubated overnight because of worsening respiratory status and hypoxemia.  She continued to be hypotensive tachycardic and was started on Neo-Synephrine. Pharmacy has been consulted for vancomycin dosing. There is no recent history of vancomycin therapy at [facility] to guide dosing.  Plan: Vancomycin 750mg  IV every 18 hours beginning 10 hours after first 1000mg  dose.    Goal trough 15-20 mcg/mL.  K 0.043  T1/2 16h  Vd 39L  Calculated concentrations at steady-state: 36.2/17.4  Vt prior to the 4th dose  Height: 5\' 4"  (162.6 cm) Weight: 123 lb 14.4 oz (56.2 kg) IBW/kg (Calculated) : 54.7  Temp (24hrs), Avg:98.8 F (37.1 C), Min:98.2 F (36.8 C), Max:100.2 F (37.9 C)  Recent Labs  Lab 06/08/2018 1523 06/01/2018 2320 05/29/2018 0419 06/04/18 0520  WBC 13.1*  --  11.4* 14.6*  CREATININE 0.70  --  0.75 0.76  LATICACIDVEN  --  1.2  --  1.9    Estimated Creatinine Clearance: 43.6 mL/min (by C-G formula based on SCr of 0.76 mg/dL).    Allergies  Allergen Reactions  . Ciprofloxacin Other (See Comments)    Hallucinations      Antimicrobials this admission: azithromycin 11//22 >>  Vancomycin  11/23 >>   Microbiology results: 11/23 BCx: pending 11/22 pleural fluid: pending  11/21 MRSA PCR: negative  Thank you for allowing pharmacy to be a part of this patient's care.  Dallie Piles, PharmD 06/04/2018 1:12 PM

## 2018-06-06 LAB — BODY FLUID CULTURE: Culture: NO GROWTH

## 2018-06-06 LAB — CYTOLOGY - NON PAP

## 2018-06-06 LAB — CALCIUM, IONIZED: CALCIUM, IONIZED, SERUM: 4.3 mg/dL — AB (ref 4.5–5.6)

## 2018-06-06 LAB — PH, BODY FLUID: pH, Body Fluid: 7.8

## 2018-06-07 ENCOUNTER — Telehealth: Payer: Self-pay

## 2018-06-07 NOTE — Telephone Encounter (Signed)
Recieved Death Certificate from ___[facility] _______ Delivered/Placed ______nurse box ______

## 2018-06-07 NOTE — Telephone Encounter (Signed)
Death cert placed in DK folder.

## 2018-06-08 NOTE — Telephone Encounter (Signed)
[employer]eral home contacted death cert ready for p/u and placed up front.

## 2018-06-09 LAB — CULTURE, BLOOD (ROUTINE X 2)
CULTURE: NO GROWTH
SPECIAL REQUESTS: ADEQUATE

## 2018-06-12 NOTE — Progress Notes (Signed)
Pt extubated and placed on 2L [place].

## 2018-06-12 NOTE — Progress Notes (Signed)
Orders received for comfort care/extubation. Orders completed, morphine gtt started, patient extubated family at bedside during procedure.  Patient expired 0105, patient has absence of HR and BP, PEA noted. Patient pronounced NP [place]ual at bedside, family supported. See CHL for further updates

## 2018-06-12 NOTE — Death Summary Note (Signed)
DEATH SUMMARY   Patient Details  Name: Melinda Little MRN: [id number] DOB: 28-Apr-1930  Admission/Discharge Information   Admit Date:  06-15-2018  Date of Death:    Time of Death:    Length of Stay: 3  Referring Physician: Dion Body, MD   Reason(s) for Hospitalization  Acute CHF exacerbation, respiratory failure, NSTEMI and severe metabolic acidosis  Diagnoses  Preliminary cause of death:  Secondary Diagnoses (including complications and co-morbidities):  Active Problems:   NSTEMI (non-ST elevated myocardial infarction) (HCC)   Acute pulmonary edema (HCC)   Malnutrition of moderate degree   CHF (congestive heart failure) (HCC)   Elevated troponin   Encounter for nasogastric (NG) tube placement   Endotracheal tube present   Hypoxia   Brief Hospital Course (including significant findings, care, treatment, and services provided and events leading to death)  Melinda Little is a 84 y.o. year old female who was admitted with Acute CHF exacerbation, respiratory failure, NSTEMI, severe metabolic acidosis and hyponatremia. She was seen by cardiology and a LHC was performed. She was found to have severe CAD, reduced EF and severe aortic stenosis. She as admitted to the ICU post LHC and the plan was to take her for a PCI once her respiratory failure improves and then refer her for a TAVR. Last night she decompensated requiring mechanical ventilation. Post intubation, patient developed cardiogenic shock requiring 2 pressors and dobutamine. After consultation with cardiology and the intensivist Dr. Soyla Murphy, the family decided to withdraw care. Patient was subsequent extubated per family's wishes and expired at 01:01. Family at bedside. Support provided  Pertinent Labs and Studies  Significant Diagnostic Studies Dg Chest 2 View  Result Date: 06/15/2018 CLINICAL DATA:  Shortness of breath. EXAM: CHEST - 2 VIEW COMPARISON:  05/21/2016 FINDINGS: There is a moderate to large left  pleural effusion and a small to moderate right pleural effusion. Moderate diffuse pulmonary edema is identified bilaterally. Background chronic lung disease is identified, likely emphysema. The cardiac silhouette is obscured by left pleural effusion. IMPRESSION: 1. Suspect moderate congestive heart failure. 2.  Emphysema (ICD10-J43.9). Electronically Signed   By: Kerby Moors M.D.   On: 2018-06-15 16:12   Dg Abd 1 View  Result Date: 06/04/2018 CLINICAL DATA:  NG tube placement EXAM: ABDOMEN - 1 VIEW COMPARISON:  Pelvis 08/25/2015 FINDINGS: Enteric tube tip projects over the left hemipelvis. This likely indicates that the catheter is in the body of a dilated stomach. Degenerative changes in the lumbar spine. Calcium gas in the abdomen. Bilateral pleural effusions. IMPRESSION: Enteric tube tip projects over the left lower quadrant likely indicating placement in the body of the dilated stomach. Electronically Signed   By: Lucienne Capers M.D.   On: 06/04/2018 06:47   Portable Chest X-ray  Result Date: 06/04/2018 CLINICAL DATA:  Central line placement EXAM: PORTABLE CHEST 1 VIEW COMPARISON:  06/04/2018 FINDINGS: Endotracheal tube with tip measuring 2.5 cm above the carina. Enteric tube tip is off the field of view but below the left hemidiaphragm. Right central venous catheter with tip over the low SVC region. No pneumothorax. Cardiac enlargement. Bilateral perihilar airspace disease likely representing edema. Bilateral pleural effusions. Calcification of the aorta. IMPRESSION: Appliances appear in satisfactory position. Cardiac enlargement. Bilateral perihilar edema. Electronically Signed   By: Lucienne Capers M.D.   On: 06/04/2018 06:45   Dg Chest Port 1 View  Result Date: 06/04/2018 CLINICAL DATA:  Congestive heart failure. Worsening shortness of breath EXAM: PORTABLE CHEST 1 VIEW COMPARISON:  05/16/2018 FINDINGS: Reaccumulating left  pleural effusion, intermediate in size. Small right pleural  effusion is unchanged. There is moderate pulmonary edema with bibasilar atelectasis. Cardiomegaly is unchanged. No pneumothorax. IMPRESSION: 1. Reaccumulating left pleural effusion, intermediate in size. Unchanged small right pleural effusion. 2. Moderate pulmonary edema with bibasilar atelectasis. Electronically Signed   By: Ulyses Jarred M.D.   On: 06/04/2018 03:15   Dg Chest Port 1 View  Result Date: 06/02/2018 CLINICAL DATA:  Status post left-sided thoracentesis. EXAM: PORTABLE CHEST 1 VIEW COMPARISON:  06/08/2018 FINDINGS: Interval near complete evacuation of the left pleural effusion. A small pleural effusion persists. No postprocedural pneumothorax is identified. Persistent small right pleural effusion. Severe chronic lung disease. IMPRESSION: Near complete evacuation of the left-sided pleural effusion without postprocedural pneumothorax. Electronically Signed   By: Marijo Sanes M.D.   On: 06/10/2018 12:20   US Thoracentesis Asp Pleural Space W/img Guide  Result Date: 05/31/2018 INDICATION: 84 year old with shortness of breath and left pleural effusion. EXAM: ULTRASOUND GUIDED LEFT THORACENTESIS MEDICATIONS: None. COMPLICATIONS: None immediate. PROCEDURE: An ultrasound guided thoracentesis was thoroughly discussed with the patient and questions answered. The benefits, risks, alternatives and complications were also discussed. The patient understands and wishes to proceed with the procedure. Written consent was obtained. Ultrasound was performed to localize and mark an adequate pocket of fluid in the left chest. The area was then prepped and draped in the normal sterile fashion. 1% Lidocaine was used for local anesthesia. Under ultrasound guidance a 6 Fr Safe-T-Centesis catheter was introduced. Thoracentesis was performed. The catheter was removed and a dressing applied. FINDINGS: A total of approximately 800 mL of dark yellow fluid was removed. Samples were sent to the laboratory as requested by  the clinical team. IMPRESSION: Successful ultrasound guided left thoracentesis yielding 800 mL of pleural fluid. Electronically Signed   By: Markus Daft M.D.   On: 05/17/2018 13:03    Microbiology Recent Results (from the past 240 hour(s))  MRSA PCR Screening     Status: None   Collection Time: 05/17/2018  9:39 PM  Result Value Ref Range Status   MRSA by PCR NEGATIVE NEGATIVE Final    Comment:        The GeneXpert MRSA Assay (FDA approved for NASAL specimens only), is one component of a comprehensive MRSA colonization surveillance program. It is not intended to diagnose MRSA infection nor to guide or monitor treatment for MRSA infections. Performed at [facility], [address] [place], [place] [address]   Body fluid culture     Status: None (Preliminary result)   Collection Time: 06/10/2018  7:30 AM  Result Value Ref Range Status   Specimen Description   Final    PLEURAL Performed at [facility], [address] [place], [place] [address]    Special Requests   Final    NONE Performed at [facility], [address] [place], [place] [address]    Gram Stain   Final    WBC PRESENT,BOTH PMN AND MONONUCLEAR NO ORGANISMS SEEN CYTOSPIN SMEAR    Culture   Final    NO GROWTH < 12 HOURS Performed at [facility], [place] [address], [place], [place] [address]    Report Status PENDING  Incomplete    Lab Basic Metabolic Panel: Recent Labs  Lab 05/31/2018 1523 06/06/2018 0419 05/16/2018 1619 06/04/18 0519 06/04/18 0520  NA 124* 127*  --   --  137  K 4.1 4.5  --   --  3.4*  CL 95* 100  --   --  102  CO2 17* 20*  --   --  25  GLUCOSE 292* 135*  --   --  182*  BUN 23 26*  --   --  34*  CREATININE 0.70 0.75  --   --  0.76  CALCIUM 9.0 8.5*  --   --  7.8*  MG  --   --  2.1 2.0  --   PHOS  --   --  4.3  --  4.2   Liver Function Tests: Recent Labs  Lab 06/04/18 0520  AST 168*  ALT 109*  ALKPHOS 67  BILITOT 0.7  PROT 5.9*  ALBUMIN 3.8    No results for input(s): LIPASE, AMYLASE in the last 168 hours. No results for input(s): AMMONIA in the last 168 hours. CBC: Recent Labs  Lab 05/25/2018 1523 06/07/2018 0419 06/04/18 0520  WBC 13.1* 11.4* 14.6*  NEUTROABS 11.3*  --   --   HGB 12.4 11.1* 10.6*  HCT 37.1 33.1* 32.5*  MCV 91.8 91.2 93.9  PLT 264 216 227   Cardiac Enzymes: Recent Labs  Lab 05/21/2018 1125 06/04/18 0520 06/04/18 0909 06/04/18 1653 06/04/18 2000  TROPONINI 10.77* 13.57* 21.84* 23.27* 31.47*   Sepsis Labs: Recent Labs  Lab 05/27/2018 1523 06/10/2018 2320 06/02/2018 0419 06/04/18 0520  PROCALCITON  --   --   --  2.37  WBC 13.1*  --  11.4* 14.6*  LATICACIDVEN  --  1.2  --  1.9    Procedures/Operations  ETT Right IJ LHC  Maurissa Ambrose S. [facility] ANP-BC Pulmonary and Critical Care Medicine [facility] Pager [phone] or [phone]  NB: This document was prepared using Dragon voice recognition software and may include unintentional dictation errors.    Jun 27, 2018, 2:02 AM

## 2018-06-12 NOTE — Progress Notes (Signed)
   06/08/2018 0025  Clinical Encounter Type  Visited With Family;Patient not available  Visit Type Follow-up;Spiritual support;Death;Patient actively dying  Referral From Nurse  Consult/Referral To Chaplain  Spiritual Encounters  Spiritual Needs Emotional;Grief support;Other (Comment)   [name] received a page for end of life. The family declined spiritual support. [name] maintained a pastoral presence and emotional support for the staff.

## 2018-06-12 NOTE — Progress Notes (Signed)
Notified by nurse that family would like to proceed with a one way extubation. Spoke with family and they indicated that they would like patient to be extubated and made full comfort care. Code status changed to DNR and EOL care orders placed. Morphine infusion started and patient extubated. Spiritual care available for support but family declined.   [name] [facility] ANP-BC Pulmonary and Critical Care Medicine [facility] Pager [phone] or [phone]  NB: This document was prepared using Dragon voice recognition software and may include unintentional dictation errors.

## 2018-06-12 DEATH — deceased
